# Patient Record
Sex: Female | Born: 1990 | Race: Black or African American | Hispanic: No | Marital: Single | Smoking: Never smoker
Health system: Southern US, Community
[De-identification: ages and names within clinical notes are randomized; demographics above are authoritative.]

## PROBLEM LIST (undated history)

## (undated) HISTORY — PX: NO PAST SURGERIES: SHX2092

---
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
## 2013-01-21 ENCOUNTER — Observation Stay: Payer: Self-pay | Admitting: Obstetrics and Gynecology

## 2013-01-21 LAB — URINALYSIS, COMPLETE
Ketone: NEGATIVE
Nitrite: NEGATIVE
Ph: 6 (ref 4.5–8.0)
Squamous Epithelial: 19
WBC UR: 106 /HPF (ref 0–5)

## 2013-01-21 LAB — FETAL FIBRONECTIN: Appearance: NORMAL

## 2013-01-22 ENCOUNTER — Ambulatory Visit: Payer: Self-pay | Admitting: Advanced Practice Midwife

## 2013-03-15 ENCOUNTER — Observation Stay: Payer: Self-pay | Admitting: Obstetrics and Gynecology

## 2013-03-15 LAB — CBC WITH DIFFERENTIAL/PLATELET
Basophil #: 0 10*3/uL (ref 0.0–0.1)
HCT: 34.9 % — ABNORMAL LOW (ref 35.0–47.0)
Lymphocyte #: 1.1 10*3/uL (ref 1.0–3.6)
MCH: 29.8 pg (ref 26.0–34.0)
MCV: 90 fL (ref 80–100)
Monocyte #: 0.4 x10 3/mm (ref 0.2–0.9)
Neutrophil #: 4.5 10*3/uL (ref 1.4–6.5)
Platelet: 187 10*3/uL (ref 150–440)
WBC: 6 10*3/uL (ref 3.6–11.0)

## 2013-03-16 ENCOUNTER — Inpatient Hospital Stay: Payer: Self-pay | Admitting: Obstetrics and Gynecology

## 2013-03-16 LAB — CBC WITH DIFFERENTIAL/PLATELET
Basophil #: 0 10*3/uL (ref 0.0–0.1)
Basophil %: 0.4 %
Eosinophil %: 0.2 %
HCT: 36.3 % (ref 35.0–47.0)
Lymphocyte #: 1.3 10*3/uL (ref 1.0–3.6)
Lymphocyte %: 15.8 %
MCH: 30.2 pg (ref 26.0–34.0)
MCV: 89 fL (ref 80–100)
Monocyte #: 0.6 x10 3/mm (ref 0.2–0.9)
Monocyte %: 7.8 %
Neutrophil %: 75.8 %
Platelet: 207 10*3/uL (ref 150–440)
RDW: 14.3 % (ref 11.5–14.5)
WBC: 8.2 10*3/uL (ref 3.6–11.0)

## 2013-03-17 LAB — HEMATOCRIT: HCT: 33 % — ABNORMAL LOW (ref 35.0–47.0)

## 2013-03-17 LAB — GC/CHLAMYDIA PROBE AMP

## 2014-08-15 NOTE — H&P (Signed)
L&D Evaluation:  History Expanded:   HPI 24 yo BF G1P0 who presents to land d at 39 weeks and 3 days, wants to labor naturally and her water is intact. she ius found to be 5-6 cm, she has a circumvallate placenta. presented with Late PNC at 18 weeks to [facility]. unknown tdap status per records.    Gravida 1    Term 0    PreTerm 0    Abortion 0    Living 0    Blood Type B positive    Group B Strep Results (Result >5wks must be treated as unknown) negative    Maternal HIV Negative    Maternal Syphilis Ab Nonreactive    Maternal Varicella Immune    Rubella Results immune    Maternal T-Dap Immune    [facility] 13-Sep-2011    Presents with contractions    Patient's Medical History No Chronic Illness    Patient's Surgical History none    Medications Pre Natal Vitamins    Allergies NKDA    Social History none    Family History Non-Contributory   ROS:   ROS All systems were reviewed.  HEENT, CNS, GI, GU, Respiratory, CV, Renal and Musculoskeletal systems were found to be normal.   Exam:   Vital Signs stable    General no apparent distress    Mental Status clear    Chest clear    Heart normal sinus rhythm    Abdomen gravid, tender with contractions    Estimated Fetal Weight Average for gestational age    Fetal Position v    Fundal Height term    Back no CVAT    Edema no edema    Reflexes 1+    Pelvic other, 5 cm,    Mebranes Intact    FHT normal rate with no decels, strictly reactive    Fetal Heart Rate 140    Ucx regular    Skin dry    Lymph no lymphadenopathy   Impression:   Impression active labor   Plan:   Plan EFM/NST, monitor contractions and for cervical change    Comments anticipate SVD    Follow Up Appointment need to schedule. in 6 weeks   Electronic Signatures: Erik Obey (MD)  (Signed 26-May-13 21:18)  Authored: L&D Evaluation   Last Updated: 26-May-13 21:18 by Erik Obey (MD)

## 2014-08-15 NOTE — H&P (Signed)
L&D Evaluation:  History Expanded:  HPI 24 yo G3P1011 at [redacted]w[redacted]d gestational age by mid trimester ultrasound with EDD of 03/18/13.  She was sent over from clinic after being examined and cervix found to be dilated to 1-2 cm.  She notes pelvic pressure for several weeks - worse over the past week and keeping her from sleeping at times.  She has no other symptoms. Specifically, she denies urinary, GI, and vaginal symptoms.  She notes positive fetal movement, no leakage of fluid, and no vaginal bleeding.  She can not identify discrete contractions.  However, she notes that she had pelvic pressure with her first child and arrived on L&D where her exam was 6cm.  She denies anything per vagina in the past 24 hours.  Her pregnancy has been complicated by late presentation to care. She also had a short interpregnancy time period.   Blood Type (Maternal) B positive   Group B Strep Results Maternal (Result >5wks must be treated as unknown) unknown/result > 5 weeks ago  collected today   Maternal HIV Negative   Maternal Varicella Immune   Rubella Results (Maternal) immune   [facility] 18-Mar-2013   Patient's Medical History No Chronic Illness  Sickle Cell Trait   Patient's Surgical History none   Medications Pre Natal Vitamins   Allergies NKDA   Social History none   Family History Non-Contributory   ROS:  ROS All systems were reviewed.  HEENT, CNS, GI, GU, Respiratory, CV, Renal and Musculoskeletal systems were found to be normal., unless noted n HPI   Exam:  Vital Signs stable  T 98.66F, P89, RR 18, BP 123/71   Urine Protein negative dipstick   General no apparent distress   Mental Status clear   Chest clear   Heart normal sinus rhythm   Abdomen gravid, non-tender   Estimated Fetal Weight Average for gestational age   Back no CVAT   Edema no edema   Pelvic no external lesions, 1-2(closer to 1 by my exam)/50/-3, posterior, moderate consistency   Mebranes Intact   FHT normal  rate with no decels   FHT Description 135/mod var/+accels/no decels   Ucx occasional   Skin no lesions   Other fFN positive   Impression:  Impression preterm labor, no evidence of current labor   Plan:  Plan UA, EFM/NST, monitor contractions and for cervical change, discharge   Comments Preterm labor: patient has remained stable over about 7 hours.  Her pressure symptoms have improved greatly.  Discussed outpatient management of her early labor.  Discussed that there is no way to know for certain when the actual cervical change occured. Discussed recommendation for betamethasone administration. Discussed need for her to return in 24 hours for second dose.  discussed that she should have a very low threshold to return to L&D should her pressure symptoms return.  Send GBS, send urine for culture, as below.  UTI: U/A suggestive of infection.  Will send urine for culture.  Rx given for macrobid to begin because she is having pressure.  Fetal well being: reassuring overall.  Follow up: recommend early follow up next week.   Follow Up Appointment need to schedule. 3-4 days for follow up of symptoms   Labs:  Lab Results: BF Analysis:  17-Oct-14 10:58   Fetal Fibronectin (comp) POSITIVE-Fetal Fibronectin DETECTED.  Appearance NORMAL  Characteristic CLEAR, COLORLESS, AQUEOUS Specimen characteristic that may be outside the parameter of a clear, colorless, aqueous sample (i.e. pink, bloody, yellow, thick, mucus-like) may cause an  invalid or false positive result.  Routine UA:  17-Oct-14 10:58   Color (UA) Yellow  Clarity (UA) Cloudy  Glucose (UA) Negative  Bilirubin (UA) Negative  Ketones (UA) Negative  Specific Gravity (UA) 1.027  Blood (UA) 1+  pH (UA) 6.0  Protein (UA) 30 mg/dL  Nitrite (UA) Negative  Leukocyte Esterase (UA) 3+ (Result(s) reported on 21 Jan 2013 at 12:33PM.)  RBC (UA) 8 /HPF  WBC (UA) 106 /HPF  Bacteria (UA) 1+  Epithelial Cells (UA) 19 /HPF  WBC Clump  (UA) PRESENT  Mucous (UA) PRESENT (Result(s) reported on 21 Jan 2013 at 12:33PM.)   Electronic Signatures: Will Bonnet (MD)  (Signed 17-Oct-14 18:30)  Authored: L&D Evaluation, Labs   Last Updated: 17-Oct-14 18:30 by Will Bonnet (MD)

## 2014-08-15 NOTE — H&P (Signed)
L&D Evaluation:  History:  HPI 24 yo G3P1011 at [redacted]w[redacted]d gestational age by mid trimester ultrasound with EDD of 03/18/13. She reports to L&D with c/o pelvic pressure.  She notes that she was seen in L&D in October for pelvic pressure and was found to be 1cm dilated with a +FFN and a UTI. She recieved betamethasone at this point, was stable, and discharged home after pressure resolved with medication for her UTI as well. She notes positive fetal movement, no leakage of fluid, and no vaginal bleeding.  Her pregnancy has been complicated by late presentation to care. She also had a short interpregnancy time period.   Presents with other, vaginal pressure   Patient's Medical History No Chronic Illness  Sickle Cell Trait   Patient's Surgical History none   Medications Pre Natal Vitamins   Allergies NKDA   Social History none   Family History Non-Contributory   ROS:  ROS All systems were reviewed.  HEENT, CNS, GI, GU, Respiratory, CV, Renal and Musculoskeletal systems were found to be normal., unless noted n HPI   Exam:  Vital Signs stable   Urine Protein negative dipstick   General no apparent distress   Mental Status clear   Chest clear   Heart normal sinus rhythm   Abdomen gravid, non-tender   Estimated Fetal Weight Average for gestational age   Pelvic by RN 5/80/-1 posterior   Mebranes Intact   FHT normal rate with no decels   FHT Description 155 baseline +accels, moderate variability   Ucx irregular, every 6 on monitor, mild   Skin dry, no lesions   Lymph no lymphadenopathy   Impression:  Impression IUP at 39.4, r/o labor   Plan:  Plan EFM/NST, monitor contractions and for cervical change   Follow Up Appointment need to schedule   Electronic Signatures: Louisa Second (CNM)  (Signed 09-Dec-14 10:48)  Authored: L&D Evaluation   Last Updated: 09-Dec-14 10:48 by Louisa Second (CNM)

## 2014-08-15 NOTE — H&P (Signed)
L&D Evaluation:  History Expanded:  HPI 24 yo G3P1011 at [redacted]w[redacted]d gestational age by mid trimester ultrasound with EDD of 03/18/13. She reports to L&D with c/o pelvic pressure.  She notes that she was seen in L&D in October for pelvic pressure and was found to be 1cm dilated with a +FFN and a UTI. She recieved betamethasone at this point, was stable, and discharged home after pressure resolved with medication for her UTI as well. She notes positive fetal movement, no leakage of fluid, and no vaginal bleeding.  Her pregnancy has been complicated by late presentation to care. She also had a short interpregnancy time period. she was seen wearlier with ctx but stopped and was sent home on morphoine she is now here at 42 cm with painful ctx/.   Gravida 3   Term 1   PreTerm 0   Abortion 1   Living 1   Blood Type (Maternal) B positive   Group B Strep Results Maternal (Result >5wks must be treated as unknown) negative   Maternal HIV Negative   Maternal Syphilis Ab Nonreactive   Maternal Varicella Immune   Rubella Results (Maternal) immune   Maternal T-Dap Immune   [facility] 18-Mar-2013   Presents with contractions, vaginal pressure   Patient's Medical History No Chronic Illness  Sickle Cell Trait   Patient's Surgical History none   Medications Pre Natal Vitamins   Allergies NKDA   Social History none   Family History Non-Contributory   ROS:  ROS All systems were reviewed.  HEENT, CNS, GI, GU, Respiratory, CV, Renal and Musculoskeletal systems were found to be normal., unless noted n HPI   Exam:  Vital Signs stable   Urine Protein negative dipstick   General no apparent distress   Mental Status clear   Chest clear   Heart normal sinus rhythm   Abdomen gravid, non-tender   Estimated Fetal Weight Average for gestational age   Pelvic by RN7/90 posterior   Mebranes Intact   FHT normal rate with no decels, cat 1   FHT Description 155 baseline +accels, moderate  variability   Ucx regular   Ucx Frequency 4 min   Skin dry, no lesions   Lymph no lymphadenopathy   Impression:  Impression IUP at 39.5 labor   Plan:  Plan EFM/NST, monitor contractions and for cervical change   Comments admit for delivery   Follow Up Appointment need to schedule   Electronic Signatures: Erik Obey (MD)  (Signed 10-Dec-14 05:54)  Authored: L&D Evaluation   Last Updated: 10-Dec-14 05:54 by Erik Obey (MD)

## 2014-08-23 ENCOUNTER — Ambulatory Visit
Admission: EM | Admit: 2014-08-23 | Discharge: 2014-08-23 | Disposition: A | Discharge status: Home / Self Care | Payer: Medicaid Other | Attending: Family Medicine | Admitting: Family Medicine

## 2014-08-23 ENCOUNTER — Ambulatory Visit: Payer: Medicaid Other

## 2014-08-23 ENCOUNTER — Encounter: Payer: Self-pay | Admitting: Emergency Medicine

## 2014-08-23 DIAGNOSIS — M25561 Pain in right knee: Secondary | ICD-10-CM | POA: Diagnosis not present

## 2014-08-23 MED ORDER — IBUPROFEN 800 MG PO TABS
800.0000 mg | ORAL_TABLET | Freq: Once | ORAL | Status: AC
  Administered 2014-08-23: 800 mg via ORAL

## 2014-08-23 MED ORDER — MELOXICAM 15 MG PO TABS: 15.0000 mg | ORAL_TABLET | Freq: Every day | ORAL | Status: DC

## 2014-08-23 NOTE — ED Provider Notes (Signed)
CSN: [id number]     Arrival date & time 08/23/14  5498 History   First MD Initiated Contact with Patient 08/23/14 1008     Chief Complaint  Patient presents with  . Knee Pain  . Knee Injury   (Consider location/radiation/quality/duration/timing/severity/associated sxs/prior Treatment) HPI   24 year old female presents for evaluation of a right knee injury. She was at her daughter's daycare this morning when she slipped and felt a pop in the medial portion of her right knee. This immediately caused some soreness up into her thigh but that has resolved. Now she has pain in the medial joint line. This is increased with ambulation and she has not been able to bear full weight on her knee since this happened. The knee is not swelling. She denies any systemic symptoms. No history of knee injuries     History reviewed. No pertinent past medical history. History reviewed. No pertinent past surgical history. History reviewed. No pertinent family history. History  Substance Use Topics  . Smoking status: Never Smoker   . Smokeless tobacco: Never Used  . Alcohol Use: No   OB History    No data available     Review of Systems  Musculoskeletal: Positive for arthralgias. Negative for joint swelling.  All other systems reviewed and are negative.   Allergies  Review of patient's allergies indicates no known allergies.  Home Medications   Prior to Admission medications   Medication Sig Start Date End Date Taking? Authorizing Provider  meloxicam (MOBIC) 15 MG tablet Take 1 tablet (15 mg total) by mouth daily. 08/23/14   Freeman Caldron Kashonda Sarkisyan, PA-C   BP 148/92 mmHg  Pulse 90  Temp(Src) 97.5 F (36.4 C) (Tympanic)  Resp 16  Ht 5\' 9"  (1.753 m)  Wt 165 lb (74.844 kg)  BMI 24.36 kg/m2  SpO2 98%  LMP  Physical Exam  Constitutional: She is oriented to person, place, and time. Vital signs are normal. She appears well-developed and well-nourished. No distress.  HENT:  Head: Normocephalic and  atraumatic.  Cardiovascular:  Pulses:      Dorsalis pedis pulses are 2+ on the right side.  Pulmonary/Chest: Effort normal. No respiratory distress.  Musculoskeletal:       Right knee: She exhibits normal range of motion, no swelling, no effusion, no deformity, no erythema, no LCL laxity, normal patellar mobility, normal meniscus and no MCL laxity. Tenderness found. Medial joint line and MCL tenderness noted. No lateral joint line, no LCL and no patellar tendon tenderness noted.       Right upper leg: Normal.       Right lower leg: Normal.  Neurological: She is alert and oriented to person, place, and time. She has normal strength. She exhibits normal muscle tone. Coordination normal.  Skin: Skin is warm and dry. No rash noted. She is not diaphoretic.  Psychiatric: She has a normal mood and affect. Judgment normal.  Nursing note and vitals reviewed.   ED Course  Procedures (including critical care time) Labs Review Labs Reviewed - No data to display  Imaging Review Dg Knee Complete 4 Views Right  08/23/2014   CLINICAL DATA:  Slipped twisting knee with pain medially  EXAM: RIGHT KNEE - COMPLETE 4+ VIEW  COMPARISON:  None.  FINDINGS: The right knee joint spaces appear normal and well preserved. No fracture is seen. No joint effusion is noted.  IMPRESSION: Negative.   Electronically Signed   By: Ivar Drape M.D.   On: 08/23/2014 11:08  MDM   1. Knee pain, right    x-rays negative. MCL sprain versus medial meniscus injury. Treat with NSAID, knee immobilizer and crutches, follow-up with orthopedics.  Meds ordered this encounter  Medications  . ibuprofen (ADVIL,MOTRIN) tablet 800 mg    Sig:   . meloxicam (MOBIC) 15 MG tablet    Sig: Take 1 tablet (15 mg total) by mouth daily.    Dispense:  30 tablet    Refill:  0       Liam Graham, PA-C 08/23/14 1129

## 2014-08-23 NOTE — Discharge Instructions (Signed)
Cryotherapy °Cryotherapy means treatment with cold. Ice or gel packs can be used to reduce both pain and swelling. Ice is the most helpful within the first 24 to 48 hours after an injury or flare-up from overusing a muscle or joint. Sprains, strains, spasms, burning pain, shooting pain, and aches can all be eased with ice. Ice can also be used when recovering from surgery. Ice is effective, has very few side effects, and is safe for most people to use. °PRECAUTIONS  °Ice is not a safe treatment option for people with: °· Raynaud phenomenon. This is a condition affecting small blood vessels in the extremities. Exposure to cold may cause your problems to return. °· Cold hypersensitivity. There are many forms of cold hypersensitivity, including: °· Cold urticaria. Red, itchy hives appear on the skin when the tissues begin to warm after being iced. °· Cold erythema. This is a red, itchy rash caused by exposure to cold. °· Cold hemoglobinuria. Red blood cells break down when the tissues begin to warm after being iced. The hemoglobin that carry oxygen are passed into the urine because they cannot combine with blood proteins fast enough. °· Numbness or altered sensitivity in the area being iced. °If you have any of the following conditions, do not use ice until you have discussed cryotherapy with your caregiver: °· Heart conditions, such as arrhythmia, angina, or chronic heart disease. °· High blood pressure. °· Healing wounds or open skin in the area being iced. °· Current infections. °· Rheumatoid arthritis. °· Poor circulation. °· Diabetes. °Ice slows the blood flow in the region it is applied. This is beneficial when trying to stop inflamed tissues from spreading irritating chemicals to surrounding tissues. However, if you expose your skin to cold temperatures for too long or without the proper protection, you can damage your skin or nerves. Watch for signs of skin damage due to cold. °HOME CARE INSTRUCTIONS °Follow  these tips to use ice and cold packs safely. °· Place a dry or damp towel between the ice and skin. A damp towel will cool the skin more quickly, so you may need to shorten the time that the ice is used. °· For a more rapid response, add gentle compression to the ice. °· Ice for no more than 10 to 20 minutes at a time. The bonier the area you are icing, the less time it will take to get the benefits of ice. °· Check your skin after 5 minutes to make sure there are no signs of a poor response to cold or skin damage. °· Rest 20 minutes or more between uses. °· Once your skin is numb, you can end your treatment. You can test numbness by very lightly touching your skin. The touch should be so light that you do not see the skin dimple from the pressure of your fingertip. When using ice, most people will feel these normal sensations in this order: cold, burning, aching, and numbness. °· Do not use ice on someone who cannot communicate their responses to pain, such as small children or people with dementia. °HOW TO MAKE AN ICE PACK °Ice packs are the most common way to use ice therapy. Other methods include ice massage, ice baths, and cryosprays. Muscle creams that cause a cold, tingly feeling do not offer the same benefits that ice offers and should not be used as a substitute unless recommended by your caregiver. °To make an ice pack, do one of the following: °· Place crushed ice or a   bag of frozen vegetables in a sealable plastic bag. Squeeze out the excess air. Place this bag inside another plastic bag. Slide the bag into a pillowcase or place a damp towel between your skin and the bag.  Mix 3 parts water with 1 part rubbing alcohol. Freeze the mixture in a sealable plastic bag. When you remove the mixture from the freezer, it will be slushy. Squeeze out the excess air. Place this bag inside another plastic bag. Slide the bag into a pillowcase or place a damp towel between your skin and the bag. SEEK MEDICAL CARE  IF:  You develop white spots on your skin. This may give the skin a blotchy (mottled) appearance.  Your skin turns blue or pale.  Your skin becomes waxy or hard.  Your swelling gets worse. MAKE SURE YOU:   Understand these instructions.  Will watch your condition.  Will get help right away if you are not doing well or get worse. Document Released: 11/18/2010 Document Revised: 08/08/2013 Document Reviewed: 11/18/2010 [facility] Patient Information 2015 [place], [place]. This information is not intended to replace advice given to you by your health care provider. Make sure you discuss any questions you have with your health care provider.  Knee Pain The knee is the complex joint between your thigh and your lower leg. It is made up of bones, tendons, ligaments, and cartilage. The bones that make up the knee are:  The femur in the thigh.  The tibia and fibula in the lower leg.  The patella or kneecap riding in the groove on the lower femur. CAUSES  Knee pain is a common complaint with many causes. A few of these causes are:  Injury, such as:  A ruptured ligament or tendon injury.  Torn cartilage.  Medical conditions, such as:  Gout  Arthritis  Infections  Overuse, over training, or overdoing a physical activity. Knee pain can be minor or severe. Knee pain can accompany debilitating injury. Minor knee problems often respond well to self-care measures or get well on their own. More serious injuries may need medical intervention or even surgery. SYMPTOMS The knee is complex. Symptoms of knee problems can vary widely. Some of the problems are:  Pain with movement and weight bearing.  Swelling and tenderness.  Buckling of the knee.  Inability to straighten or extend your knee.  Your knee locks and you cannot straighten it.  Warmth and redness with pain and fever.  Deformity or dislocation of the kneecap. DIAGNOSIS  Determining what is wrong may be very straight  forward such as when there is an injury. It can also be challenging because of the complexity of the knee. Tests to make a diagnosis may include:  Your caregiver taking a history and doing a physical exam.  Routine X-rays can be used to rule out other problems. X-rays will not reveal a cartilage tear. Some injuries of the knee can be diagnosed by:  Arthroscopy a surgical technique by which a small video camera is inserted through tiny incisions on the sides of the knee. This procedure is used to examine and repair internal knee joint problems. Tiny instruments can be used during arthroscopy to repair the torn knee cartilage (meniscus).  Arthrography is a radiology technique. A contrast liquid is directly injected into the knee joint. Internal structures of the knee joint then become visible on X-ray film.  An MRI scan is a non X-ray radiology procedure in which magnetic fields and a computer produce two- or three-dimensional images  of the inside of the knee. Cartilage tears are often visible using an MRI scanner. MRI scans have largely replaced arthrography in diagnosing cartilage tears of the knee.  Blood work.  Examination of the fluid that helps to lubricate the knee joint (synovial fluid). This is done by taking a sample out using a needle and a syringe. TREATMENT The treatment of knee problems depends on the cause. Some of these treatments are:  Depending on the injury, proper casting, splinting, surgery, or physical therapy care will be needed.  Give yourself adequate recovery time. Do not overuse your joints. If you begin to get sore during workout routines, back off. Slow down or do fewer repetitions.  For repetitive activities such as cycling or running, maintain your strength and nutrition.  Alternate muscle groups. For example, if you are a weight lifter, work the upper body on one day and the lower body the next.  Either tight or weak muscles do not give the proper support for  your knee. Tight or weak muscles do not absorb the stress placed on the knee joint. Keep the muscles surrounding the knee strong.  Take care of mechanical problems.  If you have flat feet, orthotics or special shoes may help. See your caregiver if you need help.  Arch supports, sometimes with wedges on the inner or outer aspect of the heel, can help. These can shift pressure away from the side of the knee most bothered by osteoarthritis.  A brace called an "unloader" brace also may be used to help ease the pressure on the most arthritic side of the knee.  If your caregiver has prescribed crutches, braces, wraps or ice, use as directed. The acronym for this is PRICE. This means protection, rest, ice, compression, and elevation.  Nonsteroidal anti-inflammatory drugs (NSAIDs), can help relieve pain. But if taken immediately after an injury, they may actually increase swelling. Take NSAIDs with food in your stomach. Stop them if you develop stomach problems. Do not take these if you have a history of ulcers, stomach pain, or bleeding from the bowel. Do not take without your caregiver's approval if you have problems with fluid retention, heart failure, or kidney problems.  For ongoing knee problems, physical therapy may be helpful.  Glucosamine and chondroitin are over-the-counter dietary supplements. Both may help relieve the pain of osteoarthritis in the knee. These medicines are different from the usual anti-inflammatory drugs. Glucosamine may decrease the rate of cartilage destruction.  Injections of a corticosteroid drug into your knee joint may help reduce the symptoms of an arthritis flare-up. They may provide pain relief that lasts a few months. You may have to wait a few months between injections. The injections do have a small increased risk of infection, water retention, and elevated blood sugar levels.  Hyaluronic acid injected into damaged joints may ease pain and provide lubrication.  These injections may work by reducing inflammation. A series of shots may give relief for as long as 6 months.  Topical painkillers. Applying certain ointments to your skin may help relieve the pain and stiffness of osteoarthritis. Ask your pharmacist for suggestions. Many over the-counter products are approved for temporary relief of arthritis pain.  In some countries, doctors often prescribe topical NSAIDs for relief of chronic conditions such as arthritis and tendinitis. A review of treatment with NSAID creams found that they worked as well as oral medications but without the serious side effects. PREVENTION  Maintain a healthy weight. Extra pounds put more strain on your  joints.  Get strong, stay limber. Weak muscles are a common cause of knee injuries. Stretching is important. Include flexibility exercises in your workouts.  Be smart about exercise. If you have osteoarthritis, chronic knee pain or recurring injuries, you may need to change the way you exercise. This does not mean you have to stop being active. If your knees ache after jogging or playing basketball, consider switching to swimming, water aerobics, or other low-impact activities, at least for a few days a week. Sometimes limiting high-impact activities will provide relief.  Make sure your shoes fit well. Choose footwear that is right for your sport.  Protect your knees. Use the proper gear for knee-sensitive activities. Use kneepads when playing volleyball or laying carpet. Buckle your seat belt every time you drive. Most shattered kneecaps occur in car accidents.  Rest when you are tired. SEEK MEDICAL CARE IF:  You have knee pain that is continual and does not seem to be getting better.  SEEK IMMEDIATE MEDICAL CARE IF:  Your knee joint feels hot to the touch and you have a high fever. MAKE SURE YOU:   Understand these instructions.  Will watch your condition.  Will get help right away if you are not doing well or get  worse. Document Released: 01/19/2007 Document Revised: 06/16/2011 Document Reviewed: 01/19/2007 [facility] Patient Information 2015 [place], [place]. This information is not intended to replace advice given to you by your health care provider. Make sure you discuss any questions you have with your health care provider.

## 2014-08-23 NOTE — ED Notes (Signed)
Patient states that she slipped at her daughter's daycare this morning and felt a pop in her R knee.  Patient c/o of R knee pain.

## 2015-03-22 ENCOUNTER — Encounter: Payer: Self-pay | Admitting: Internal Medicine

## 2015-03-22 DIAGNOSIS — J3081 Allergic rhinitis due to animal (cat) (dog) hair and dander: Secondary | ICD-10-CM | POA: Insufficient documentation

## 2015-08-01 ENCOUNTER — Ambulatory Visit: Payer: Self-pay

## 2015-08-01 ENCOUNTER — Ambulatory Visit (INDEPENDENT_AMBULATORY_CARE_PROVIDER_SITE_OTHER): Payer: Self-pay

## 2015-08-01 DIAGNOSIS — Z111 Encounter for screening for respiratory tuberculosis: Secondary | ICD-10-CM

## 2015-08-03 LAB — TB SKIN TEST
INDURATION: 0 mm
TB Skin Test: NEGATIVE

## 2016-01-18 ENCOUNTER — Encounter: Payer: Self-pay | Admitting: Internal Medicine

## 2016-01-18 ENCOUNTER — Ambulatory Visit (INDEPENDENT_AMBULATORY_CARE_PROVIDER_SITE_OTHER): Payer: Self-pay | Admitting: Family Medicine

## 2016-01-18 VITALS — BP 138/86 | HR 84 | Temp 98.7°F | Resp 16 | Ht 69.0 in | Wt 165.0 lb

## 2016-01-18 DIAGNOSIS — N3090 Cystitis, unspecified without hematuria: Secondary | ICD-10-CM

## 2016-01-18 DIAGNOSIS — R350 Frequency of micturition: Secondary | ICD-10-CM

## 2016-01-18 LAB — POCT URINALYSIS DIPSTICK
BILIRUBIN UA: NEGATIVE
Blood, UA: 2
Glucose, UA: NEGATIVE
Nitrite, UA: POSITIVE
SPEC GRAV UA: 1.025
UROBILINOGEN UA: 0.2
pH, UA: 7

## 2016-01-18 MED ORDER — SULFAMETHOXAZOLE-TRIMETHOPRIM 800-160 MG PO TABS: 1.0000 | ORAL_TABLET | Freq: Two times a day (BID) | ORAL | 0 refills | Status: DC

## 2016-01-18 NOTE — Progress Notes (Signed)
Name: Monica Moran   MRN: [id number]    DOB: 13-May-1990   Date:01/18/2016       Progress Note  Subjective  Chief Complaint  Chief Complaint  Patient presents with  . Urinary Frequency    Urinary Frequency   This is a new problem. The current episode started yesterday. The problem occurs every urination. The problem has been waxing and waning. The quality of the pain is described as burning. The pain is mild. There has been no fever. She is sexually active. Associated symptoms include frequency. Pertinent negatives include no chills, discharge, flank pain, hematuria, hesitancy, nausea, possible pregnancy, sweats or urgency. She has tried nothing for the symptoms. The treatment provided mild relief.    No problem-specific Assessment & Plan notes found for this encounter.   History reviewed. No pertinent past medical history.  History reviewed. No pertinent surgical history.  History reviewed. No pertinent family history.  Social History   Social History  . Marital status: Single    Spouse name: N/A  . Number of children: N/A  . Years of education: N/A   Occupational History  . Not on file.   Social History Main Topics  . Smoking status: Never Smoker  . Smokeless tobacco: Never Used  . Alcohol use No  . Drug use: No  . Sexual activity: Not on file   Other Topics Concern  . Not on file   Social History Narrative  . No narrative on file    No Known Allergies   Review of Systems  Constitutional: Negative for chills, fever, malaise/fatigue and weight loss.  HENT: Negative for ear discharge, ear pain and sore throat.   Eyes: Negative for blurred vision.  Respiratory: Negative for cough, sputum production, shortness of breath and wheezing.   Cardiovascular: Negative for chest pain, palpitations and leg swelling.  Gastrointestinal: Negative for abdominal pain, blood in stool, constipation, diarrhea, heartburn, melena and nausea.  Genitourinary: Positive for  frequency. Negative for dysuria, flank pain, hematuria, hesitancy and urgency.  Musculoskeletal: Negative for back pain, joint pain, myalgias and neck pain.  Skin: Negative for rash.  Neurological: Negative for dizziness, tingling, sensory change, focal weakness and headaches.  Endo/Heme/Allergies: Negative for environmental allergies and polydipsia. Does not bruise/bleed easily.  Psychiatric/Behavioral: Negative for depression and suicidal ideas. The patient is not nervous/anxious and does not have insomnia.      Objective  Vitals:   01/18/16 1601  BP: 138/86  Pulse: 84  Resp: 16  Temp: 98.7 F (37.1 C)  SpO2: 95%  Weight: 165 lb (74.8 kg)  Height: 5\' 9"  (1.753 m)    Physical Exam  Constitutional: She is well-developed, well-nourished, and in no distress. No distress.  HENT:  Head: Normocephalic and atraumatic.  Right Ear: External ear normal.  Left Ear: External ear normal.  Nose: Nose normal.  Mouth/Throat: Oropharynx is clear and moist.  Eyes: Conjunctivae and EOM are normal. Pupils are equal, round, and reactive to light. Right eye exhibits no discharge. Left eye exhibits no discharge.  Neck: Normal range of motion. Neck supple. No JVD present. No thyromegaly present.  Cardiovascular: Normal rate, regular rhythm, normal heart sounds and intact distal pulses.  Exam reveals no gallop and no friction rub.   No murmur heard. Pulmonary/Chest: Effort normal and breath sounds normal. She has no wheezes. She has no rales.  Abdominal: Soft. Bowel sounds are normal. She exhibits no mass. There is tenderness in the suprapubic area. There is no guarding.  Musculoskeletal: Normal  range of motion. She exhibits no edema.  Lymphadenopathy:    She has no cervical adenopathy.  Neurological: She is alert. She has normal reflexes.  Skin: Skin is warm and dry. She is not diaphoretic.  Psychiatric: Mood and affect normal.  Nursing note and vitals reviewed.     Assessment &  Plan  Problem List Items Addressed This Visit    None    Visit Diagnoses    Urinary frequency    -  Primary   Relevant Orders   POCT urinalysis dipstick        Dr. Otilio Miu [facility]  01/18/16

## 2016-06-13 ENCOUNTER — Encounter: Payer: Self-pay | Admitting: Obstetrics & Gynecology

## 2016-06-13 ENCOUNTER — Ambulatory Visit (INDEPENDENT_AMBULATORY_CARE_PROVIDER_SITE_OTHER): Payer: BC Managed Care – PPO | Admitting: Obstetrics & Gynecology

## 2016-06-13 VITALS — BP 120/80 | HR 68 | Ht 65.0 in | Wt 193.0 lb

## 2016-06-13 DIAGNOSIS — Z789 Other specified health status: Secondary | ICD-10-CM | POA: Diagnosis not present

## 2016-06-13 NOTE — Progress Notes (Signed)
  History of Present Illness:  Monica Moran is a 26 y.o. that had a NEXPLANON placed approximately 1 month ago. Since that time, she states that no concerns.  Also in need of PAP as has not had PAP or annual in 3 years.  The following portions of the patient's history were reviewed and updated as appropriate: allergies, current medications, past family history, past medical history, past social history, past surgical history and problem list.  PMHx: She  has no past medical history on file. Also,  has no past surgical history on file., family history is not on file.,  reports that she has never smoked. She has never used smokeless tobacco. She reports that she does not drink alcohol or use drugs.  She has a current medication list which includes the following prescription(s): etonogestrel. Also, has No Known Allergies.  Review of Systems  Constitutional: Negative for chills, fever and malaise/fatigue.  HENT: Negative for congestion, sinus pain and sore throat.   Eyes: Negative for blurred vision and pain.  Respiratory: Negative for cough and wheezing.   Cardiovascular: Negative for chest pain and leg swelling.  Gastrointestinal: Negative for abdominal pain, constipation, diarrhea, heartburn, nausea and vomiting.  Genitourinary: Negative for dysuria, frequency, hematuria and urgency.  Musculoskeletal: Negative for back pain, joint pain, myalgias and neck pain.  Skin: Negative for itching and rash.  Neurological: Negative for dizziness, tremors and weakness.  Endo/Heme/Allergies: Does not bruise/bleed easily.  Psychiatric/Behavioral: Negative for depression. The patient is not nervous/anxious and does not have insomnia.     Physical Exam:  BP 120/80   Pulse 68   Ht 5\' 5"  (1.651 m)   Wt 193 lb (87.5 kg)   BMI 32.12 kg/m  Body mass index is 32.12 kg/m. Constitutional: Well nourished, well developed female in no acute distress.  Abdomen: diffusely non tender to palpation, non  distended, and no masses, hernias Neuro: Grossly intact Psych:  Normal mood and affect.   MS: left arm with palpable implant and no skin disruptions or swelling Chest clear Heart reg   Pelvic:   Vulva: Normal appearance.  No lesions.  Vagina: No lesions or abnormalities noted.  Support: Normal pelvic support.  Urethra No masses tenderness or scarring.  Meatus Normal size without lesions or prolapse.  Cervix: Normal appearance.  No lesions.  Anus: Normal exam.  No lesions.  Perineum: Normal exam.  No lesions.        Bimanual   Uterus: Normal size.  Non-tender.  Mobile.  AV.  Adnexae: No masses.  Non-tender to palpation.  Cul-de-sac: Negative for abnormality.    Assessment:  Nexplanon present in proper location; pt doing well Also, PAP done w normal pelvic exam  Plan: She was told to continue to use barrier contraception, in order to prevent any STIs, and to take a home pregnancy test or call us if she ever thinks she may be pregnant, and that her Nexplanon expires in 3 years.  She was amenable to this plan and we will see her back for annual/PRN.  Barnett Applebaum, MD, [facility], [facility] 06/13/2016  1:55 PM

## 2016-06-13 NOTE — Patient Instructions (Signed)

## 2016-06-18 LAB — PAP LB, CT-NG, RFX HPV ASCU
CHLAMYDIA, NUC. ACID AMP: NEGATIVE
Gonococcus, Nuc. Acid Amp: NEGATIVE
PAP Smear Comment: 0

## 2016-12-04 ENCOUNTER — Ambulatory Visit (INDEPENDENT_AMBULATORY_CARE_PROVIDER_SITE_OTHER): Payer: BC Managed Care – PPO

## 2016-12-04 ENCOUNTER — Ambulatory Visit
Admission: EM | Admit: 2016-12-04 | Discharge: 2016-12-04 | Disposition: A | Discharge status: Home / Self Care | Payer: BC Managed Care – PPO | Attending: Family Medicine | Admitting: Family Medicine

## 2016-12-04 DIAGNOSIS — R519 Headache, unspecified: Secondary | ICD-10-CM

## 2016-12-04 DIAGNOSIS — Z3202 Encounter for pregnancy test, result negative: Secondary | ICD-10-CM

## 2016-12-04 DIAGNOSIS — R112 Nausea with vomiting, unspecified: Secondary | ICD-10-CM | POA: Diagnosis not present

## 2016-12-04 DIAGNOSIS — R0602 Shortness of breath: Secondary | ICD-10-CM | POA: Diagnosis not present

## 2016-12-04 DIAGNOSIS — R8271 Bacteriuria: Secondary | ICD-10-CM

## 2016-12-04 DIAGNOSIS — R42 Dizziness and giddiness: Secondary | ICD-10-CM

## 2016-12-04 DIAGNOSIS — R0789 Other chest pain: Secondary | ICD-10-CM | POA: Diagnosis not present

## 2016-12-04 DIAGNOSIS — R079 Chest pain, unspecified: Secondary | ICD-10-CM

## 2016-12-04 DIAGNOSIS — R51 Headache: Secondary | ICD-10-CM | POA: Diagnosis not present

## 2016-12-04 LAB — URINALYSIS, COMPLETE (UACMP) WITH MICROSCOPIC
GLUCOSE, UA: NEGATIVE mg/dL
Hgb urine dipstick: NEGATIVE
LEUKOCYTES UA: NEGATIVE
Nitrite: NEGATIVE
PH: 7 (ref 5.0–8.0)
Protein, ur: 30 mg/dL — AB
SPECIFIC GRAVITY, URINE: 1.025 (ref 1.005–1.030)

## 2016-12-04 LAB — RAPID STREP SCREEN (MED CTR MEBANE ONLY): Streptococcus, Group A Screen (Direct): NEGATIVE

## 2016-12-04 LAB — PREGNANCY, URINE: PREG TEST UR: NEGATIVE

## 2016-12-04 MED ORDER — ONDANSETRON 8 MG PO TBDP: 8.0000 mg | ORAL_TABLET | Freq: Three times a day (TID) | ORAL | 0 refills | Status: DC | PRN

## 2016-12-04 MED ORDER — CEPHALEXIN 500 MG PO CAPS: 500.0000 mg | ORAL_CAPSULE | Freq: Two times a day (BID) | ORAL | 0 refills | Status: AC

## 2016-12-04 NOTE — ED Provider Notes (Signed)
[facility]    CSN: [id number] Arrival date & time: 12/04/16  2703     History   Chief Complaint Chief Complaint  Patient presents with  . Chest Pain    HPI Monica Moran is a 26 y.o. female.   Patient's 26 year old black female with a positive review of systems. She reports having chest pain which she has several months ago as clear this was not as intense ER doctors and anxiety spell. She reports some shortness of breath no cough when previously she had a cough associated with chest pain. While the chest pain she's had headache also very vague as far as when the headache started along the medial going on she's had a sore throat but she states she has strep earlier this spring. As was before but still irritates her times. She denies any burning urination or urine did show bacteria present but it was not the best specimen unfortunately either.   The history is provided by the patient. No language interpreter was used.  Chest Pain  Pain location:  Substernal area Pain quality: pressure and tightness   Pain radiates to:  Does not radiate Pain severity:  Moderate Onset quality:  Unable to specify Timing:  Intermittent Progression:  Waxing and waning Chronicity:  Recurrent Context: breathing   Relieved by:  Nothing Ineffective treatments:  None tried Associated symptoms: anxiety, dizziness, dysphagia, headache, nausea, shortness of breath and vomiting     History reviewed. No pertinent past medical history.  Patient Active Problem List   Diagnosis Date Noted  . Uses contraceptive implants for birth control 06/13/2016  . 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 No pertinent surgical history.  OB History    Gravida Para Term Preterm AB Living   2 2 0     2   SAB TAB Ectopic Multiple Live Births                   Home Medications    Prior to Admission medications   Medication Sig Start Date End Date Taking? Authorizing Provider    cephALEXin (KEFLEX) 500 MG capsule Take 1 capsule (500 mg total) by mouth 2 (two) times daily. 12/04/16 12/11/16  Frederich Cha, MD  etonogestrel (NEXPLANON) 68 MG IMPL implant 1 each by Subdermal route once.    [provider]  ondansetron (ZOFRAN ODT) 8 MG disintegrating tablet Take 1 tablet (8 mg total) by mouth every 8 (eight) hours as needed for nausea or vomiting. 12/04/16   Frederich Cha, MD    Family History History reviewed. No pertinent family history.  Social History Social History  Substance Use Topics  . Smoking status: Never Smoker  . Smokeless tobacco: Never Used  . Alcohol use No     Allergies   Patient has no known allergies.   Review of Systems Review of Systems  HENT: Positive for sore throat, trouble swallowing and voice change.   Respiratory: Positive for shortness of breath.   Cardiovascular: Positive for chest pain.  Gastrointestinal: Positive for nausea and vomiting.  Neurological: Positive for dizziness and headaches.  All other systems reviewed and are negative.    Physical Exam Triage Vital Signs ED Triage Vitals  Enc Vitals Group     BP 12/04/16 0848 130/90     Pulse Rate 12/04/16 0848 76     Resp 12/04/16 0848 18     Temp 12/04/16 0848 98.4 F (36.9 C)     Temp Source 12/04/16 0848  Oral     SpO2 12/04/16 0848 100 %     Weight 12/04/16 0849 195 lb 6 oz (88.6 kg)     Height --      Head Circumference --      Peak Flow --      Pain Score 12/04/16 0849 6     Pain Loc --      Pain Edu? --      Excl. in [place]? --    No data found.   Updated Vital Signs BP 130/90 (BP Location: Left Arm)   Pulse 76   Temp 98.4 F (36.9 C) (Oral)   Resp 18   Wt 195 lb 6 oz (88.6 kg)   LMP  (Approximate) Comment: denies preg  SpO2 100%   BMI 32.51 kg/m   Visual Acuity Right Eye Distance:   Left Eye Distance:   Bilateral Distance:    Right Eye Near:   Left Eye Near:    Bilateral Near:     Physical Exam  Constitutional: She is oriented to  person, place, and time. She appears well-developed and well-nourished. No distress.  HENT:  Head: Normocephalic.  Eyes: Pupils are equal, round, and reactive to light.  Neck: Normal range of motion. Neck supple. No thyromegaly present.  Cardiovascular: Normal rate, regular rhythm and normal heart sounds.   Pulmonary/Chest: Effort normal and breath sounds normal. No respiratory distress.  Abdominal: Soft.  Musculoskeletal: Normal range of motion. She exhibits no edema or deformity.  Neurological: She is alert and oriented to person, place, and time. No cranial nerve deficit. Coordination normal.  Skin: Skin is warm. She is not diaphoretic.  Psychiatric: She has a normal mood and affect.  Vitals reviewed.    UC Treatments / Results  Labs (all labs ordered are listed, but only abnormal results are displayed) Labs Reviewed  URINALYSIS, COMPLETE (UACMP) WITH MICROSCOPIC - Abnormal; Notable for the following:       Result Value   Color, Urine AMBER (*)    APPearance HAZY (*)    Bilirubin Urine SMALL (*)    Ketones, ur TRACE (*)    Protein, ur 30 (*)    Squamous Epithelial / LPF 6-30 (*)    Bacteria, UA FEW (*)    All other components within normal limits  RAPID STREP SCREEN (NOT AT [facility])  URINE CULTURE  CULTURE, GROUP A STREP [facility])  PREGNANCY, URINE    EKG  EKG Interpretation None      ED ECG REPORT I, Lazarus Sudbury H, the attending physician, personally viewed and interpreted this ECG.   Date: 12/04/2016  EKG Time:09:$#:33  Rate:74  Rhythm: normal EKG, normal sinus rhythm, there are no previous tracings available for comparison  Axis: 33  Intervals:none  ST&T Change: none Radiology Dg Chest 2 View  Result Date: 12/04/2016 CLINICAL DATA:  26 year old female with 2 days of mid chest pain and shortness of breath. EXAM: CHEST  2 VIEW COMPARISON:  None. FINDINGS: The heart size and mediastinal contours are normal. Both lungs are clear. No pneumothorax or pleural  effusion. Visualized tracheal air column is within normal limits. Mild to moderate thoracolumbar scoliosis. No acute osseous abnormality identified. Negative visible bowel gas pattern. IMPRESSION: Normal aside from mild scoliosis. No acute cardiopulmonary abnormality. Electronically Signed   By: Genevie Ann M.D.   On: 12/04/2016 09:43    Procedures Procedures (including critical care time)  Medications Ordered in UC Medications - No data to display  Results for orders placed  or performed during the hospital encounter of 12/04/16  Rapid strep screen  Result Value Ref Range   Streptococcus, Group A Screen (Direct) NEGATIVE NEGATIVE  Urinalysis, Complete w Microscopic  Result Value Ref Range   Color, Urine AMBER (A) YELLOW   APPearance HAZY (A) CLEAR   Specific Gravity, Urine 1.025 1.005 - 1.030   pH 7.0 5.0 - 8.0   Glucose, UA NEGATIVE NEGATIVE mg/dL   Hgb urine dipstick NEGATIVE NEGATIVE   Bilirubin Urine SMALL (A) NEGATIVE   Ketones, ur TRACE (A) NEGATIVE mg/dL   Protein, ur 30 (A) NEGATIVE mg/dL   Nitrite NEGATIVE NEGATIVE   Leukocytes, UA NEGATIVE NEGATIVE   Squamous Epithelial / LPF 6-30 (A) NONE SEEN   WBC, UA 6-30 0 - 5 WBC/hpf   RBC / HPF 0-5 0 - 5 RBC/hpf   Bacteria, UA FEW (A) NONE SEEN   Mucus PRESENT   Pregnancy, urine  Result Value Ref Range   Preg Test, Ur NEGATIVE NEGATIVE    Initial Impression / Assessment and Plan / UC Course  I have reviewed the triage vital signs and the nursing notes.  Pertinent labs & imaging results that were available during my care of the patient were reviewed by me and considered in my medical decision making (see chart for details).     Patient with multiple problems and multiple issues this time may be all viral she does have bacteria but she is not having symptoms of UTI but going to place on Keflex for week 500 mg 1 tablet twice day culture urine strep is also been cultured Zofran for nausea work today and tomorrow.  Final Clinical  Impressions(s) / UC Diagnoses   Final diagnoses:  Chest pain, unspecified type  Acute nonintractable headache, unspecified headache type  Non-intractable vomiting with nausea, unspecified vomiting type  Bacteriuria    New Prescriptions New Prescriptions   CEPHALEXIN (KEFLEX) 500 MG CAPSULE    Take 1 capsule (500 mg total) by mouth 2 (two) times daily.   ONDANSETRON (ZOFRAN ODT) 8 MG DISINTEGRATING TABLET    Take 1 tablet (8 mg total) by mouth every 8 (eight) hours as needed for nausea or vomiting.   Note: This dictation was prepared with Dragon dictation along with smaller phrase technology. Any transcriptional errors that result from this process are unintentional.  Controlled Substance Prescriptions [place] Controlled Substance Registry consulted? Not Applicable   Frederich Cha, MD 12/04/16 1028

## 2016-12-04 NOTE — ED Triage Notes (Signed)
Pt reports 2 days of intermittent headache, stomach "feels tight" , nausea, and chest feels tight. Pain 6/10

## 2016-12-05 LAB — URINE CULTURE: SPECIAL REQUESTS: NORMAL

## 2016-12-06 LAB — CULTURE, GROUP A STREP (THRC)

## 2017-04-27 ENCOUNTER — Encounter: Payer: Self-pay | Admitting: Internal Medicine

## 2017-04-27 ENCOUNTER — Ambulatory Visit: Payer: BC Managed Care – PPO | Admitting: Internal Medicine

## 2017-04-27 VITALS — BP 124/86 | HR 87 | Ht 65.0 in | Wt 192.0 lb

## 2017-04-27 DIAGNOSIS — Z111 Encounter for screening for respiratory tuberculosis: Secondary | ICD-10-CM | POA: Diagnosis not present

## 2017-04-27 DIAGNOSIS — J3081 Allergic rhinitis due to animal (cat) (dog) hair and dander: Secondary | ICD-10-CM

## 2017-04-27 DIAGNOSIS — Z0000 Encounter for general adult medical examination without abnormal findings: Secondary | ICD-10-CM | POA: Diagnosis not present

## 2017-04-27 MED ORDER — TUBERCULIN PPD 5 UNIT/0.1ML ID SOLN
5.0000 [IU] | Freq: Once | INTRADERMAL | Status: DC
  Administered 2017-04-27: 5 [IU] via INTRADERMAL

## 2017-04-27 NOTE — Patient Instructions (Signed)

## 2017-04-27 NOTE — Progress Notes (Signed)
Date:  04/27/2017   Name:  Monica Moran   DOB:  12-01-1990   MRN:  [id number]   Chief Complaint: Tb injection (Has not been seen since 01/2016. Needed OV to remain established patient. )  Monica Moran is a 27 y.o. female who presents today for her Complete Annual Exam. She feels well. She reports exercising running after her children but no structured program. She reports she is sleeping well.  She sees GYN for Pap.  Nexplanon was replaced last year. Immunizations are reviewed and recommendations provided.   Age appropriate screening tests are discussed. Counseling given for risk factor reduction interventions. She declines flu vaccine. She needs PPD for employment.  Review of Systems  Constitutional: Negative for chills, fatigue, fever and unexpected weight change.  Respiratory: Negative for cough, chest tightness, shortness of breath and wheezing.   Cardiovascular: Negative for chest pain.  Gastrointestinal: Negative for abdominal pain and constipation.  Genitourinary: Negative for dysuria and menstrual problem.  Musculoskeletal: Negative for arthralgias.  Skin: Negative for rash.  Allergic/Immunologic: Positive for environmental allergies.  Neurological: Negative for dizziness and headaches.  Psychiatric/Behavioral: Negative for sleep disturbance.    Patient Active Problem List   Diagnosis Date Noted  . Uses contraceptive implants for birth control 06/13/2016  . 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 Authorizing Provider  etonogestrel (NEXPLANON) 68 MG IMPL implant 1 each by Subdermal route once.    [provider]       Frederich Cha, MD    No Known Allergies  History reviewed. No pertinent surgical history.  Social History   Tobacco Use  . Smoking status: Never Smoker  . Smokeless tobacco: Never Used  Substance Use Topics  . Alcohol use: No  . Drug use: No      Medication list has been reviewed and updated.  PHQ 2/9 Scores 04/27/2017  PHQ - 2 Score 0    Physical Exam  Constitutional: She is oriented to person, place, and time. She appears well-developed. No distress.  HENT:  Head: Normocephalic and atraumatic.  Right Ear: Tympanic membrane and ear canal normal.  Left Ear: Tympanic membrane and ear canal normal.  Nose: Right sinus exhibits no maxillary sinus tenderness. Left sinus exhibits no maxillary sinus tenderness.  Mouth/Throat: No posterior oropharyngeal erythema.  Neck: Normal range of motion. Neck supple. Carotid bruit is not present. No thyromegaly present.  Cardiovascular: Normal rate, regular rhythm and normal heart sounds.  Pulmonary/Chest: Effort normal and breath sounds normal. No respiratory distress. She has no wheezes.  Abdominal: Soft. Bowel sounds are normal. She exhibits no distension. There is no tenderness.  Musculoskeletal: Normal range of motion. She exhibits no edema.  Neurological: She is alert and oriented to person, place, and time.  Skin: Skin is warm and dry. No rash noted.  Psychiatric: She has a normal mood and affect. Her behavior is normal. Thought content normal.  Nursing note and vitals reviewed.   BP 124/86   Pulse 87   Ht 5\' 5"  (1.651 m)   Wt 192 lb (87.1 kg)   SpO2 100%   BMI 31.95 kg/m   Assessment and Plan: 1. Annual physical exam Pap with GYN - CBC with Differential/Platelet - Comprehensive metabolic panel - Lipid panel - TSH  2. Encounter for screening for respiratory tuberculosis PPD today   No orders of the defined types were placed in this encounter.   Partially  dictated using Editor, commissioning. Any errors are unintentional.  Halina Maidens, MD [facility]  04/27/2017

## 2017-04-28 LAB — CBC WITH DIFFERENTIAL/PLATELET
Basophils Absolute: 0 10*3/uL (ref 0.0–0.2)
Basos: 0 %
EOS (ABSOLUTE): 0.1 10*3/uL (ref 0.0–0.4)
EOS: 1 %
HEMATOCRIT: 41.4 % (ref 34.0–46.6)
HEMOGLOBIN: 13.2 g/dL (ref 11.1–15.9)
IMMATURE GRANS (ABS): 0 10*3/uL (ref 0.0–0.1)
IMMATURE GRANULOCYTES: 0 %
LYMPHS: 31 %
Lymphocytes Absolute: 1.7 10*3/uL (ref 0.7–3.1)
MCH: 29.1 pg (ref 26.6–33.0)
MCHC: 31.9 g/dL (ref 31.5–35.7)
MCV: 91 fL (ref 79–97)
MONOCYTES: 6 %
Monocytes Absolute: 0.4 10*3/uL (ref 0.1–0.9)
Neutrophils Absolute: 3.5 10*3/uL (ref 1.4–7.0)
Neutrophils: 62 %
Platelets: 246 10*3/uL (ref 150–379)
RBC: 4.54 x10E6/uL (ref 3.77–5.28)
RDW: 13.7 % (ref 12.3–15.4)
WBC: 5.6 10*3/uL (ref 3.4–10.8)

## 2017-04-28 LAB — LIPID PANEL
CHOL/HDL RATIO: 2.2 ratio (ref 0.0–4.4)
Cholesterol, Total: 114 mg/dL (ref 100–199)
HDL: 51 mg/dL (ref >39)
LDL CALC: 57 mg/dL (ref 0–99)
Triglycerides: 32 mg/dL (ref 0–149)
VLDL Cholesterol Cal: 6 mg/dL (ref 5–40)

## 2017-04-28 LAB — COMPREHENSIVE METABOLIC PANEL
ALBUMIN: 4 g/dL (ref 3.5–5.5)
ALT: 6 IU/L (ref 0–32)
AST: 7 IU/L (ref 0–40)
Albumin/Globulin Ratio: 1.4 (ref 1.2–2.2)
Alkaline Phosphatase: 55 IU/L (ref 39–117)
BUN / CREAT RATIO: 20 (ref 9–23)
BUN: 13 mg/dL (ref 6–20)
Bilirubin Total: 0.3 mg/dL (ref 0.0–1.2)
CALCIUM: 9.1 mg/dL (ref 8.7–10.2)
CO2: 24 mmol/L (ref 20–29)
Chloride: 104 mmol/L (ref 96–106)
Creatinine, Ser: 0.66 mg/dL (ref 0.57–1.00)
GFR, EST AFRICAN AMERICAN: 143 mL/min/{1.73_m2} (ref >59)
GFR, EST NON AFRICAN AMERICAN: 124 mL/min/{1.73_m2} (ref >59)
GLUCOSE: 88 mg/dL (ref 65–99)
Globulin, Total: 2.9 g/dL (ref 1.5–4.5)
Potassium: 4.4 mmol/L (ref 3.5–5.2)
Sodium: 143 mmol/L (ref 134–144)
TOTAL PROTEIN: 6.9 g/dL (ref 6.0–8.5)

## 2017-04-28 LAB — TSH: TSH: 0.645 u[IU]/mL (ref 0.450–4.500)

## 2017-04-29 ENCOUNTER — Ambulatory Visit (INDEPENDENT_AMBULATORY_CARE_PROVIDER_SITE_OTHER): Payer: BC Managed Care – PPO

## 2017-04-29 DIAGNOSIS — Z111 Encounter for screening for respiratory tuberculosis: Secondary | ICD-10-CM

## 2017-04-29 LAB — TB SKIN TEST
Induration: 0 mm
TB Skin Test: NEGATIVE

## 2017-05-21 ENCOUNTER — Emergency Department
Admission: EM | Admit: 2017-05-21 | Discharge: 2017-05-22 | Disposition: A | Discharge status: Home / Self Care | Payer: BC Managed Care – PPO | Attending: Emergency Medicine | Admitting: Emergency Medicine

## 2017-05-21 ENCOUNTER — Other Ambulatory Visit: Payer: Self-pay

## 2017-05-21 DIAGNOSIS — F329 Major depressive disorder, single episode, unspecified: Secondary | ICD-10-CM

## 2017-05-21 DIAGNOSIS — F32A Depression, unspecified: Secondary | ICD-10-CM

## 2017-05-21 DIAGNOSIS — F4325 Adjustment disorder with mixed disturbance of emotions and conduct: Secondary | ICD-10-CM | POA: Diagnosis not present

## 2017-05-21 DIAGNOSIS — F330 Major depressive disorder, recurrent, mild: Secondary | ICD-10-CM

## 2017-05-21 DIAGNOSIS — F4322 Adjustment disorder with anxiety: Secondary | ICD-10-CM

## 2017-05-21 DIAGNOSIS — R45851 Suicidal ideations: Secondary | ICD-10-CM | POA: Insufficient documentation

## 2017-05-21 LAB — POCT PREGNANCY, URINE: Preg Test, Ur: NEGATIVE

## 2017-05-21 LAB — ACETAMINOPHEN LEVEL

## 2017-05-21 LAB — URINE DRUG SCREEN, QUALITATIVE (ARMC ONLY)
AMPHETAMINES, UR SCREEN: NOT DETECTED
Barbiturates, Ur Screen: NOT DETECTED
Benzodiazepine, Ur Scrn: NOT DETECTED
COCAINE METABOLITE, UR ~~LOC~~: NOT DETECTED
Cannabinoid 50 Ng, Ur ~~LOC~~: NOT DETECTED
MDMA (ECSTASY) UR SCREEN: NOT DETECTED
METHADONE SCREEN, URINE: NOT DETECTED
OPIATE, UR SCREEN: NOT DETECTED
Phencyclidine (PCP) Ur S: NOT DETECTED
Tricyclic, Ur Screen: NOT DETECTED

## 2017-05-21 LAB — COMPREHENSIVE METABOLIC PANEL
ALBUMIN: 4.2 g/dL (ref 3.5–5.0)
ALK PHOS: 58 U/L (ref 38–126)
ALT: 10 U/L — ABNORMAL LOW (ref 14–54)
ANION GAP: 8 (ref 5–15)
AST: 14 U/L — ABNORMAL LOW (ref 15–41)
BUN: 21 mg/dL — ABNORMAL HIGH (ref 6–20)
CALCIUM: 9.3 mg/dL (ref 8.9–10.3)
CO2: 24 mmol/L (ref 22–32)
Chloride: 107 mmol/L (ref 101–111)
Creatinine, Ser: 0.74 mg/dL (ref 0.44–1.00)
GFR calc Af Amer: >60 mL/min (ref >60)
GFR calc non Af Amer: >60 mL/min (ref >60)
GLUCOSE: 105 mg/dL — AB (ref 65–99)
Potassium: 3.9 mmol/L (ref 3.5–5.1)
SODIUM: 139 mmol/L (ref 135–145)
Total Bilirubin: 0.5 mg/dL (ref 0.3–1.2)
Total Protein: 7.7 g/dL (ref 6.5–8.1)

## 2017-05-21 LAB — CBC
HEMATOCRIT: 40.4 % (ref 35.0–47.0)
HEMOGLOBIN: 13.7 g/dL (ref 12.0–16.0)
MCH: 30.1 pg (ref 26.0–34.0)
MCHC: 33.9 g/dL (ref 32.0–36.0)
MCV: 88.9 fL (ref 80.0–100.0)
Platelets: 268 10*3/uL (ref 150–440)
RBC: 4.54 MIL/uL (ref 3.80–5.20)
RDW: 13.4 % (ref 11.5–14.5)
WBC: 7.4 10*3/uL (ref 3.6–11.0)

## 2017-05-21 LAB — SALICYLATE LEVEL: Salicylate Lvl: <7 mg/dL (ref 2.8–30.0)

## 2017-05-21 LAB — ETHANOL: Alcohol, Ethyl (B): <10 mg/dL (ref <10)

## 2017-05-21 NOTE — ED Notes (Signed)

## 2017-05-21 NOTE — ED Provider Notes (Signed)
[facility] Emergency Department Provider Note  ____________________________________________  Time seen: Approximately 10:22 PM  I have reviewed the triage vital signs and the nursing notes.   HISTORY  Chief Complaint Suicidal    HPI Monica Moran is a 27 y.o. female, otherwise healthy, presenting with depression and suicidal ideations.  The patient reports that she has recently split up with the father of her children, and today was feeling sad because of Valentine's Day.  She states that her mother heard her say that she "did not want to be here anymore," and suggested that she be evaluated in the emergency department for suicidal ideations.  The patient denies that she is actively suicidal and has not had any suicide plan.  She denies HI, hallucinations.  She has no medical complaints at this time.  History reviewed. No pertinent past medical history.  Patient Active Problem List   Diagnosis Date Noted  . Uses contraceptive implants for birth control 06/13/2016  . 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 No pertinent surgical history.  Current Outpatient Rx  . Order #: [id number] Class: Historical Med    Allergies Patient has no known allergies.  Family History  Problem Relation Age of Onset  . Hypertension Paternal Grandmother   . Diabetes Paternal Grandmother     Social History Social History   Tobacco Use  . Smoking status: Never Smoker  . Smokeless tobacco: Never Used  Substance Use Topics  . Alcohol use: No    Frequency: Never  . Drug use: No    Review of Systems Constitutional: No fever/chills. Eyes: No visual changes. ENT: No sore throat. No congestion or rhinorrhea. Cardiovascular: Denies chest pain. Denies palpitations. Respiratory: Denies shortness of breath.  No cough. Gastrointestinal: No abdominal pain.  No nausea, no vomiting.  No diarrhea.  No constipation. Genitourinary: Negative for  dysuria. Musculoskeletal: Negative for back pain. Skin: Negative for rash. Neurological: Negative for headaches. No focal numbness, tingling or weakness.  Psychiatric:Positive SI without plan.  No HI or hallucinations.  ____________________________________________   PHYSICAL EXAM:  VITAL SIGNS: ED Triage Vitals  Enc Vitals Group     BP 05/21/17 2141 (!) 137/96     Pulse Rate 05/21/17 2141 84     Resp 05/21/17 2141 16     Temp 05/21/17 2141 98.6 F (37 C)     Temp Source 05/21/17 2141 Oral     SpO2 05/21/17 2141 95 %     Weight 05/21/17 2134 190 lb (86.2 kg)     Height 05/21/17 2134 5\' 6"  (1.676 m)     Head Circumference --      Peak Flow --      Pain Score --      Pain Loc --      Pain Edu? --      Excl. in [place]? --     Constitutional: Alert and oriented. Well appearing and in no acute distress. Answers questions appropriately. Eyes: Conjunctivae are normal.  EOMI. No scleral icterus. Head: Atraumatic. Nose: No congestion/rhinnorhea. Mouth/Throat: Mucous membranes are moist.  Neck: No stridor.  Supple.   Cardiovascular: Normal rate, regular rhythm. No murmurs, rubs or gallops.  Respiratory: Normal respiratory effort.  No accessory muscle use or retractions. Lungs CTAB.  No wheezes, rales or ronchi. Musculoskeletal: Moves all extremities well. Neurologic:  A&Ox3.  Speech is clear.  Face and smile are symmetric.  EOMI.  Moves all extremities well. Skin:  Skin is warm, dry and intact. No  rash noted. Psychiatric: Patient has a depressed mood with a flat affect.  She has normal speech and good insight.  She denies any homicidal ideations or hallucinations.  She denies any active suicidality or plan.  ____________________________________________   LABS (all labs ordered are listed, but only abnormal results are displayed)  Labs Reviewed  COMPREHENSIVE METABOLIC PANEL - Abnormal; Notable for the following components:      Result Value   Glucose, Bld 105 (*)    BUN 21 (*)     AST 14 (*)    ALT 10 (*)    All other components within normal limits  ETHANOL  CBC  SALICYLATE LEVEL  ACETAMINOPHEN LEVEL  URINE DRUG SCREEN, QUALITATIVE ([facility] ONLY)  HCG, QUANTITATIVE, PREGNANCY  POC URINE PREG, ED  POCT PREGNANCY, URINE   ____________________________________________  EKG  Not indicated ____________________________________________  RADIOLOGY  No results found.  ____________________________________________   PROCEDURES  Procedure(s) performed: None  Procedures  Critical Care performed: No ____________________________________________   INITIAL IMPRESSION / ASSESSMENT AND PLAN / ED COURSE  Pertinent labs & imaging results that were available during my care of the patient were reviewed by me and considered in my medical decision making (see chart for details).  27 y.o. female presenting with depression and stating that she did not want to be here anymore, suggesting suicidality.  The patient denies any active plan.  I will plan to place the patient under involuntary commitment until she is able to be assessed by a psychiatrist.  From a medical standpoint, she is hemodynamically stable and has no medical complaints.  She is cleared for psychiatric disposition at this time. ____________________________________________  FINAL CLINICAL IMPRESSION(S) / ED DIAGNOSES  Final diagnoses:  Depression, unspecified depression type  Suicidal ideation         NEW MEDICATIONS STARTED DURING THIS VISIT:  New Prescriptions   No medications on file       Eula Listen, MD 05/21/17 2232

## 2017-05-21 NOTE — BH Assessment (Signed)
TTS consult complete. Full assessment note to follow.

## 2017-05-21 NOTE — ED Notes (Signed)
Food/fluids were offered; Patient refused.

## 2017-05-21 NOTE — ED Notes (Signed)
Pt has belongings locked up and the key is number 14 and is in the pyxis.

## 2017-05-21 NOTE — ED Notes (Addendum)
Pt to the ER via [place] police for suicidal ideation. Pt says her mom called the police. Pt had an argument with her ex today. Pt says she and her ex split up a few months ago and she has had a difficult time with the situation and especially with today being valentines day, it made it even more stressful. Pt says that her and the ex have children together so they have to maintain some type of relationship.

## 2017-05-21 NOTE — ED Triage Notes (Signed)
Pt to the er for suicidal threat to EX over the phone. Pt states she said it out of anger. Pt states she told him she feels like she doesn't want to be here anymore.

## 2017-05-22 DIAGNOSIS — F330 Major depressive disorder, recurrent, mild: Secondary | ICD-10-CM

## 2017-05-22 DIAGNOSIS — F4325 Adjustment disorder with mixed disturbance of emotions and conduct: Secondary | ICD-10-CM

## 2017-05-22 LAB — HCG, QUANTITATIVE, PREGNANCY: hCG, Beta Chain, Quant, S: <1 m[IU]/mL (ref <5)

## 2017-05-22 NOTE — BH Assessment (Signed)
Assessment Note  Monica Moran is an 27 y.o. female who present to the ED via [employer]. Pt states that she is having a hard time dealing with a recent break up with the father of her children. Pt states that Valentine's Day was probably a trigger but that she has been "taking the break up pretty hard". "He left me for someone else and so me and the kids moved back in with my mom in September or October and I've just been taking it pretty hard. I guess maybe I'm taking it harder than most people. I cry a lot. I don't think I need to be here but the cops and my mom don't think that I am emotionally okay. I just want to be at home with my kids."   During the assessment the pt was soft spoken, calm, cooperative and pleasant to work with. She was sad and close to becoming tearful at times. Pt admits that the breakup has caused her to have some depression. She reports her symptoms as Isolating herself from others, not enjoying usual pleasures, frequent and uncontrollable crying spells. Pt denies any illicit drug or alcohol use. She reports having a poor appetite and decreased sleep resulting in only 3-4 hours of sleep nightly.   Pt denies SI/HI A/V H.   Diagnosis: Depression  Past Medical History: History reviewed. No pertinent past medical history.  History reviewed. No pertinent surgical history.  Family History:  Family History  Problem Relation Age of Onset  . Hypertension Paternal Grandmother   . Diabetes Paternal Grandmother     Social History:  reports that  has never smoked. she has never used smokeless tobacco. She reports that she does not drink alcohol or use drugs.  Additional Social History:  Alcohol / Drug Use Pain Medications: See MAR Prescriptions: See MAR Over the Counter: See MAR History of alcohol / drug use?: No history of alcohol / drug abuse(Pt denies use)  CIWA: CIWA-Ar BP: (!) 137/96 Pulse Rate: 84 COWS:    Allergies: No Known Allergies  Home  Medications:  (Not in a hospital admission)  OB/GYN Status:  No LMP recorded. Patient has had an implant.  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 : No Suicidal Intent: No Has patient had any suicidal intent within the past 6 months prior to admission? : No Is patient at risk for suicide?: Yes Suicidal Plan?: No Has patient had any suicidal plan within the past 6 months prior to admission? : No Access to Means: No What has been your use of drugs/alcohol within the last 12 months?: Pt denies Previous Attempts/Gestures: No How  many times?: 0 Other Self Harm Risks: Pt denies Triggers for Past Attempts: Other personal contacts(Break up with s/o) Intentional Self Injurious Behavior: None Family Suicide History: No Recent stressful life event(s): Conflict (Comment)(Break up) Persecutory voices/beliefs?: No Depression: Yes Depression Symptoms: Insomnia, Tearfulness, Isolating, Loss of interest in usual  pleasures, Feeling worthless/self pity Substance abuse history and/or treatment for substance abuse?: No Suicide prevention information given to non-admitted patients: Not applicable  Risk to Others within the past 6 months Homicidal Ideation: No Does patient have any lifetime risk of violence toward others beyond the six months prior to admission? : No Thoughts of Harm to Others: No Current Homicidal Intent: No Current Homicidal Plan: No Access to Homicidal Means: No Identified Victim: n/a History of harm to others?: No Assessment of Violence: None Noted Violent Behavior Description: n/a Does patient have access to weapons?: No Criminal Charges Pending?: No Does patient have a court date: No Is patient on probation?: No  Psychosis Hallucinations: None noted Delusions: None noted  Mental Status Report Appearance/Hygiene: In scrubs Eye Contact: Good Motor Activity: Freedom of movement Speech: Soft, Logical/coherent Level of Consciousness: Alert Mood: Depressed, Sad, Pleasant Affect: Depressed, Sad Anxiety Level: None Thought Processes: Coherent, Relevant Judgement: Unimpaired Orientation: Person, Place, Time, Situation, Appropriate for developmental age Obsessive Compulsive Thoughts/Behaviors: None  Cognitive Functioning Concentration: Normal Memory: Recent Intact, Remote Intact IQ: Average Insight: Good Impulse Control: Fair Appetite: Poor Weight Loss: 2 Weight Gain: 0 Sleep: Decreased Total Hours of Sleep: 4 Vegetative Symptoms: None  ADLScreening [facility] Assessment Services) Patient's cognitive ability adequate to safely complete daily activities?: Yes Patient able to express need for assistance with ADLs?: Yes Independently performs ADLs?: Yes (appropriate for developmental age)  Prior Inpatient Therapy Prior Inpatient Therapy: No  Prior Outpatient Therapy Prior Outpatient Therapy: No Does patient have an ACCT team?: No Does patient have Intensive  In-House Services?  : No Does patient have [facility] services? : No Does patient have P4CC services?: No  ADL Screening (condition at time of admission) Patient's cognitive ability adequate to safely complete daily activities?: Yes Is the patient deaf or have difficulty hearing?: No Does the patient have difficulty seeing, even when wearing glasses/contacts?: No Does the patient have difficulty concentrating, remembering, or making decisions?: No Patient able to express need for assistance with ADLs?: Yes Does the patient have difficulty dressing or bathing?: No Independently performs ADLs?: Yes (appropriate for developmental age) Does the patient have difficulty walking or climbing stairs?: No Weakness of Legs: None Weakness of Arms/Hands: None  Home Assistive Devices/Equipment Home Assistive Devices/Equipment: None  Therapy Consults (therapy consults require a physician order) PT Evaluation Needed: No OT Evalulation Needed: No SLP Evaluation Needed: No Abuse/Neglect Assessment (Assessment to be complete while patient is alone) Abuse/Neglect Assessment Can Be Completed: Yes Physical Abuse: Denies Verbal Abuse: Denies Sexual Abuse: Denies Exploitation of patient/patient's resources: Denies Self-Neglect: Denies Values / Beliefs Cultural Requests During Hospitalization: None Spiritual Requests During Hospitalization: None Consults Spiritual Care Consult Needed: No Social Work Consult Needed: No Regulatory affairs officer (For Healthcare) Does Patient Have a Medical Advance Directive?: No    Additional Information 1:1 In Past 12 Months?: No CIRT Risk: No Elopement Risk: No Does patient have medical clearance?: Yes  Child/Adolescent Assessment Running Away Risk: Denies Bed-Wetting: Denies Destruction of Property: Denies Cruelty to Animals: Denies Stealing: Denies Rebellious/Defies Authority: Denies Satanic Involvement: Denies Science writer: Denies Problems at [employer]:  Denies Gang Involvement: Denies  Disposition:  Disposition Initial Assessment Completed for this Encounter: Yes Disposition of  Patient: Pending Review with psychiatrist  On Site Evaluation by:   Reviewed with Physician:    Oree Mirelez D Tequisha Maahs 05/22/2017 12:16 AM

## 2017-05-22 NOTE — Consult Note (Signed)
[facility]iat[facility] Consult   Reason for Consult: Consult for 27 year old woman brought here under IVC for alleged suicidal ideation Referring Physician: Quale Patient Identification: Monica Moran MRN:  [id number] Principal Diagnosis: Adjustment disorder with mixed disturbance of emotions and conduct Diagnosis:   Patient Active Problem List   Diagnosis Date Noted  . Adjustment disorder with mixed disturbance of emotions and conduct [F43.25] 05/22/2017  . 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 Uses contraceptive implants for birth control [Z78.9] 06/13/2016  . 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 female patient admitted with "the cops made me calm".  HPI: Patient interviewed chart reviewed.  27 year old woman brought in by police last night.  Family had called police because they were worried about the patient's mood.  Patient admits that she was sitting in her car crying inconsolably and that she had made a comment about "not wanting to be here" but denies that she had any suicidal thought or intent.  Major stress yesterday was that it was Valentine's Day and her ex-partner was behaving in a emotionally manipulative way that got her upset.  Patient denies any suicidal thought.  Denies any psychosis denies homicidal ideation.  She had not been drinking or using any drugs.  Other than yesterday she says her mood is generally functional although she does feel down fairly often.  Cries from time to time.  Appetite is not as good as it could be.  Sleeps pretty adequately.  Does not have any psychotic symptoms or suicidal thoughts.  All of her sadness flows from how upset she is that her long-term boyfriend abandon her several months ago for another woman.  Social history: Patient originally from [place] but has been living here and [facility] for a couple years.   Lives with her mother.  Works in a daycare.  Has 2 young children ages 83 and 26 that she takes care of.  Medical history: No significant medical problems  Substance abuse history: Denies any alcohol or drug use current or past  Past Psychiatric History: No history of psychiatric hospitalization no history of any treatment in fact.  Never seen a counselor or therapist.  Never been in a psychiatric hospital or on psychiatric medicine.  No history of suicide attempts or self injury or violence.  Risk to Self: Suicidal Ideation: No Suicidal Intent: No Is patient at risk for suicide?: Yes Suicidal Plan?: No Access to Means: No What has been your use of drugs/alcohol within the last 12 months?: Pt denies How many times?: 0 Other Self Harm Risks: Pt denies Triggers for Past Attempts: Other personal contacts(Break up with s/o) Intentional Self Injurious Behavior: None Risk to Others: Homicidal Ideation: No Thoughts of Harm to Others: No Current Homicidal Intent: No Current Homicidal Plan: No Access to Homicidal Means: No Identified Victim: n/a History of harm to others?: No Assessment of Violence: None Noted Violent Behavior Description: n/a Does patient have access to weapons?: No Criminal Charges Pending?: No Does patient have a court date: No Prior Inpatient Therapy: Prior Inpatient Therapy: No Prior Outpatient Therapy: Prior Outpatient Therapy: No Does patient have an ACCT team?: No Does patient have Intensive In-House Services?  : No Does patient have [facility] services? : No Does patient have P4CC services?: No  Past Medical History: History reviewed. No pertinent past medical history. History reviewed. No pertinent surgical history. Family History:  Family History  Problem Relation Age of Onset  . Hypertension Paternal Grandmother   . Diabetes Paternal Grandmother    Family Psychiatric  History: Denies any family history Social History:  Social History   Substance and  Sexual Activity  Alcohol Use No  . Frequency: Never     Social History   Substance and Sexual Activity  Drug Use No    Social History   Socioeconomic History  . Marital status: Single    Spouse name: None  . Number of children: 2  . Years of education: None  . Highest education level: None  Social Needs  . Financial resource strain: None  . Food insecurity - worry: None  . Food insecurity - inability: None  . Transportation needs - medical: None  . Transportation needs - non-medical: None  Occupational History  . None  Tobacco Use  . Smoking status: Never Smoker  . Smokeless tobacco: Never Used  Substance and Sexual Activity  . Alcohol use: No    Frequency: Never  . Drug use: No  . Sexual activity: No    Birth control/protection: Implant    Comment: nexplanon  Other Topics Concern  . None  Social History Narrative  . None   Additional Social History:    Allergies:  No Known Allergies  Labs:  Results for orders placed or performed during the hospital encounter of 05/21/17 (from the past 48 hour(s))  Comprehensive metabolic panel     Status: Abnormal   Collection Time: 05/21/17  9:38 PM  Result Value Ref Range   Sodium 139 135 - 145 mmol/L   Potassium 3.9 3.5 - 5.1 mmol/L   Chloride 107 101 - 111 mmol/L   CO2 24 22 - 32 mmol/L   Glucose, Bld 105 (H) 65 - 99 mg/dL   BUN 21 (H) 6 - 20 mg/dL   Creatinine, Ser 0.74 0.44 - 1.00 mg/dL   Calcium 9.3 8.9 - 10.3 mg/dL   Total Protein 7.7 6.5 - 8.1 g/dL   Albumin 4.2 3.5 - 5.0 g/dL   AST 14 (L) 15 - 41 U/L   ALT 10 (L) 14 - 54 U/L   Alkaline Phosphatase 58 38 - 126 U/L   Total Bilirubin 0.5 0.3 - 1.2 mg/dL   GFR calc non Af Amer >60 >60 mL/min   GFR calc Af Amer >60 >60 mL/min    Comment: (NOTE) The eGFR has been calculated using the CKD EPI equation. This calculation has not been validated in all clinical situations. eGFR's persistently <60 mL/min signify possible Chronic Kidney Disease.    Anion gap 8 5 -  15    Comment: Performed at [facility], [address] [place], [place] [address]  Ethanol     Status: None   Collection Time: 05/21/17  9:38 PM  Result Value Ref Range   Alcohol, Ethyl (B) <10 <10 mg/dL    Comment:        LOWEST DETECTABLE LIMIT FOR SERUM ALCOHOL IS 10 mg/dL FOR MEDICAL PURPOSES ONLY Performed at [facility], [address] [place], [place] [address]   Salicylate level     Status: None   Collection Time: 05/21/17  9:38 PM  Result Value Ref Range   Salicylate Lvl <1.8 2.8 - 30.0 mg/dL    Comment: Performed at [facility], [address] [place], [place] [address]  Acetaminophen level     Status: Abnormal   Collection Time: 05/21/17  9:38 PM  Result  Value Ref Range   Acetaminophen (Tylenol), Serum <10 (L) 10 - 30 ug/mL    Comment:        THERAPEUTIC CONCENTRATIONS VARY SIGNIFICANTLY. A RANGE OF 10-30 ug/mL MAY BE AN EFFECTIVE CONCENTRATION FOR MANY PATIENTS. HOWEVER, SOME ARE BEST TREATED AT CONCENTRATIONS OUTSIDE THIS RANGE. ACETAMINOPHEN CONCENTRATIONS >150 ug/mL AT 4 HOURS AFTER INGESTION AND >50 ug/mL AT 12 HOURS AFTER INGESTION ARE OFTEN ASSOCIATED WITH TOXIC REACTIONS. Performed at [facility], [address] [place], [place] [address]   cbc     Status: None   Collection Time: 05/21/17  9:38 PM  Result Value Ref Range   WBC 7.4 3.6 - 11.0 K/uL   RBC 4.54 3.80 - 5.20 MIL/uL   Hemoglobin 13.7 12.0 - 16.0 g/dL   HCT 40.4 35.0 - 47.0 %   MCV 88.9 80.0 - 100.0 fL   MCH 30.1 26.0 - 34.0 pg   MCHC 33.9 32.0 - 36.0 g/dL   RDW 13.4 11.5 - 14.5 %   Platelets 268 150 - 440 K/uL    Comment: Performed at [facility], [address] [place], [place] [address]  hCG, quantitative, pregnancy     Status: None   Collection Time: 05/21/17  9:38 PM  Result Value Ref Range   hCG, Beta Chain, Quant, S <1 <5 mIU/mL    Comment:          GEST. AGE      CONC.  (mIU/mL)   <=1 WEEK        5 - 50     2 WEEKS        50 - 500     3 WEEKS       100 - 10,000     4 WEEKS     1,000 - 30,000     5 WEEKS     3,500 - 115,000   6-8 WEEKS     12,000 - 270,000    12 WEEKS     15,000 - 220,000        FEMALE AND NON-PREGNANT FEMALE:     LESS THAN 5 mIU/mL Performed at [facility], [address] [place], [place] [address]   Urine Drug Screen, Qualitative     Status: None   Collection Time: 05/21/17  9:40 PM  Result Value Ref Range   Tricyclic, Ur Screen NONE DETECTED NONE DETECTED   Amphetamines, Ur Screen NONE DETECTED NONE DETECTED   MDMA (Ecstasy)Ur Screen NONE DETECTED NONE DETECTED   Cocaine Metabolite,Ur [place] NONE DETECTED NONE DETECTED   Opiate, Ur Screen NONE DETECTED NONE DETECTED   Phencyclidine (PCP) Ur S NONE DETECTED NONE DETECTED   Cannabinoid 50 Ng, Ur [place] NONE DETECTED NONE DETECTED   Barbiturates, Ur Screen NONE DETECTED NONE DETECTED   Benzodiazepine, Ur Scrn NONE DETECTED NONE DETECTED   Methadone Scn, Ur NONE DETECTED NONE DETECTED    Comment: (NOTE) Tricyclics + metabolites, urine    Cutoff 1000 ng/mL Amphetamines + metabolites, urine  Cutoff 1000 ng/mL MDMA (Ecstasy), urine              Cutoff 500 ng/mL Cocaine Metabolite, urine          Cutoff 300 ng/mL Opiate + metabolites, urine        Cutoff 300 ng/mL Phencyclidine (PCP), urine         Cutoff 25 ng/mL Cannabinoid, urine                 Cutoff  50 ng/mL Barbiturates + metabolites, urine  Cutoff 200 ng/mL Benzodiazepine, urine              Cutoff 200 ng/mL Methadone, urine                   Cutoff 300 ng/mL The urine drug screen provides only a preliminary, unconfirmed analytical test result and should not be used for non-medical purposes. Clinical consideration and professional judgment should be applied to any positive drug screen result due to possible interfering substances. A more specific alternate chemical method must be used in order to obtain a confirmed analytical result. Gas chromatography / mass  spectrometry (GC/MS) is the preferred confirmat ory method. Performed at [facility], [address] [place], [place] [address]   Pregnancy, urine POC     Status: None   Collection Time: 05/21/17  9:59 PM  Result Value Ref Range   Preg Test, Ur NEGATIVE NEGATIVE    Comment:        THE SENSITIVITY OF THIS METHODOLOGY IS >24 mIU/mL     No current facility-administered medications for this encounter.    Current Outpatient Medications  Medication Sig Dispense Refill  . etonogestrel (NEXPLANON) 68 MG IMPL implant 1 each by Subdermal route once.      Musculoskeletal: Strength & Muscle Tone: within normal limits Gait & Station: normal Patient leans: N/A  Psychiatric Specialty Exam: Physical Exam  Nursing note and vitals reviewed. Constitutional: She appears well-developed and well-nourished.  HENT:  Head: Normocephalic and atraumatic.  Eyes: Conjunctivae are normal. Pupils are equal, round, and reactive to light.  Neck: Normal range of motion.  Cardiovascular: Regular rhythm and normal heart sounds.  Respiratory: Effort normal. No respiratory distress.  GI: Soft.  Musculoskeletal: Normal range of motion.  Neurological: She is alert.  Skin: Skin is warm and dry.  Psychiatric: She has a normal mood and affect. Her speech is normal and behavior is normal. Judgment and thought content normal. Cognition and memory are normal.    Review of Systems  Constitutional: Negative.   HENT: Negative.   Eyes: Negative.   Respiratory: Negative.   Cardiovascular: Negative.   Gastrointestinal: Negative.   Musculoskeletal: Negative.   Skin: Negative.   Neurological: Negative.   Psychiatric/Behavioral: Negative for depression, hallucinations, memory loss, substance abuse and suicidal ideas. The patient is not nervous/anxious and does not have insomnia.     Blood pressure (!) 137/96, pulse 84, temperature 98.6 F (37 C), temperature source Oral, resp. rate 16, height '5\' 6"'   (1.676 m), weight 86.2 kg (190 lb), SpO2 95 %.Body mass index is 30.67 kg/m.  General Appearance: Casual  Eye Contact:  Good  Speech:  Clear and Coherent  Volume:  Normal  Mood:  Euthymic  Affect:  Constricted  Thought Process:  Goal Directed  Orientation:  Full (Time, Place, and Person)  Thought Content:  Logical  Suicidal Thoughts:  No  Homicidal Thoughts:  No  Memory:  Immediate;   Good Recent;   Good Remote;   Good  Judgement:  Good  Insight:  Good  Psychomotor Activity:  Normal  Concentration:  Concentration: Good  Recall:  Good  Fund of Knowledge:  Good  Language:  Good  Akathisia:  No  Handed:  Right  AIMS (if indicated):     Assets:  Communication Skills Desire for Improvement Financial Resources/Insurance Housing Physical Health Resilience Social Support  ADL's:  Intact  Cognition:  WNL  Sleep:  Treatment Plan Summary: Plan 27 year old woman who had a bad day yesterday for pretty obvious reasons.  Denies that she ever had any suicidal intention.  No evidence that she did anything to harm herself.  Patient is currently calm and lucid.  Does not meet commitment criteria.  Discontinue IVC.  Counseled patient about the availability of therapy and psychiatric treatment in the community.  Encouraged her to consider seeking out treatment through [facility].  Supportive counseling done.  Labs all reviewed.  Case reviewed with ER physician and TTS and nursing.  Disposition: No evidence of imminent risk to self or others at present.   Patient does not meet criteria for psychiatric inpatient admission. Supportive therapy provided about ongoing stressors. Discussed crisis plan, support from social network, calling 911, coming to the Emergency Department, and calling Suicide Hotline.  Alethia Berthold, MD 05/22/2017 2:27 PM

## 2017-05-22 NOTE — ED Provider Notes (Signed)
Dr. Weber Cooks is seen and evaluated the patient.  Reports he has rescinded the patient's IVC, he advises discharge with voluntary outpatient follow-up.  Currently not risk to herself or others, recommends discharge without change in any medication regimen.   Delman Kitten, MD 05/22/17 1414

## 2017-05-22 NOTE — Discharge Instructions (Signed)

## 2017-06-02 ENCOUNTER — Ambulatory Visit (INDEPENDENT_AMBULATORY_CARE_PROVIDER_SITE_OTHER): Payer: Self-pay | Admitting: Internal Medicine

## 2017-06-02 ENCOUNTER — Encounter: Payer: Self-pay | Admitting: Internal Medicine

## 2017-06-02 VITALS — BP 120/78 | HR 74 | Ht 66.0 in | Wt 190.0 lb

## 2017-06-02 DIAGNOSIS — N3000 Acute cystitis without hematuria: Secondary | ICD-10-CM

## 2017-06-02 LAB — POC URINALYSIS WITH MICROSCOPIC (NON AUTO)MANUAL RESULT
Bilirubin, UA: NEGATIVE
Blood, UA: NEGATIVE
Crystals: 0
Glucose, UA: NEGATIVE
Ketones, UA: NEGATIVE
MUCUS UA: 0
Nitrite, UA: NEGATIVE
PH UA: 6 (ref 5.0–8.0)
PROTEIN UA: NEGATIVE
RBC: 0 M/uL — AB (ref 4.04–5.48)
SPEC GRAV UA: 1.015 (ref 1.010–1.025)
Urobilinogen, UA: 0.2 E.U./dL
WBC CASTS UA: 5

## 2017-06-02 NOTE — Patient Instructions (Signed)

## 2017-06-02 NOTE — Progress Notes (Signed)
    Date:  06/02/2017   Name:  Monica Moran   DOB:  June 21, 1990   MRN:  [id number]   Chief Complaint: Urinary Tract Infection (Started 2 days ago. Burning and frequent urination. Took AZO first. No help. Took old antibiotics and no longer had symptoms. Unsure if still have infection. )  Urinary Tract Infection   This is a new problem. The current episode started in the past 7 days. The problem has been resolved. The patient is experiencing no pain. There has been no fever. Pertinent negatives include no chills, frequency, hematuria, nausea, urgency or vomiting. She has tried antibiotics (took an old antibiotic - name unknown) for the symptoms. The treatment provided significant relief.     Review of Systems  Constitutional: Negative for chills, fatigue and fever.  Respiratory: Negative for chest tightness and shortness of breath.   Cardiovascular: Negative for chest pain.  Gastrointestinal: Negative for diarrhea, nausea and vomiting.  Genitourinary: Negative for dysuria, frequency, hematuria and urgency.  Musculoskeletal: Negative for back pain.    Patient Active Problem List   Diagnosis Date Noted  . 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 Mild recurrent major depression ([place]) 05/22/2017  . Uses contraceptive implants for birth control 06/13/2016  . 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 Authorizing Provider  etonogestrel (NEXPLANON) 68 MG IMPL implant 1 each by Subdermal route once.   Yes [provider]    No Known Allergies  History reviewed. No pertinent surgical history.  Social History   Tobacco Use  . Smoking status: Never Smoker  . Smokeless tobacco: Never Used  Substance Use Topics  . Alcohol use: No    Frequency: Never  . Drug use: No     Medication list has been reviewed and updated.  PHQ 2/9 Scores 06/02/2017 04/27/2017  PHQ -  2 Score 0 0  PHQ- 9 Score 0 -    Physical Exam  Constitutional: She appears well-developed and well-nourished.  Cardiovascular: Normal rate, regular rhythm and normal heart sounds.  Pulmonary/Chest: Effort normal and breath sounds normal. No respiratory distress.  Abdominal: Soft. Bowel sounds are normal. There is no tenderness. There is no rebound, no guarding and no CVA tenderness.  Psychiatric: She has a normal mood and affect.  Nursing note and vitals reviewed.   BP 120/78   Pulse 74   Ht 5\' 6"  (1.676 m)   Wt 190 lb (86.2 kg)   SpO2 100%   BMI 30.67 kg/m   Assessment and Plan: 1. Acute cystitis without hematuria Appears treated If sx recur, will send in antibiotics; otherwise f/u if needed - POC urinalysis w microscopic (non auto)   No orders of the defined types were placed in this encounter.   Partially dictated using Editor, commissioning. Any errors are unintentional.  Halina Maidens, MD [facility]  06/02/2017

## 2017-07-06 ENCOUNTER — Encounter (INDEPENDENT_AMBULATORY_CARE_PROVIDER_SITE_OTHER): Payer: Self-pay

## 2017-08-05 ENCOUNTER — Other Ambulatory Visit: Payer: Self-pay

## 2017-08-05 ENCOUNTER — Ambulatory Visit
Admission: EM | Admit: 2017-08-05 | Discharge: 2017-08-05 | Disposition: A | Discharge status: Home / Self Care | Payer: Self-pay | Attending: Family Medicine | Admitting: Family Medicine

## 2017-08-05 DIAGNOSIS — N390 Urinary tract infection, site not specified: Secondary | ICD-10-CM

## 2017-08-05 DIAGNOSIS — R319 Hematuria, unspecified: Secondary | ICD-10-CM

## 2017-08-05 LAB — URINALYSIS, COMPLETE (UACMP) WITH MICROSCOPIC
BILIRUBIN URINE: NEGATIVE
GLUCOSE, UA: NEGATIVE mg/dL
NITRITE: NEGATIVE
PROTEIN: NEGATIVE mg/dL
Specific Gravity, Urine: 1.02 (ref 1.005–1.030)
pH: 7 (ref 5.0–8.0)

## 2017-08-05 MED ORDER — SULFAMETHOXAZOLE-TRIMETHOPRIM 800-160 MG PO TABS: 1.0000 | ORAL_TABLET | Freq: Two times a day (BID) | ORAL | 0 refills | Status: AC

## 2017-08-05 NOTE — Discharge Instructions (Addendum)
Take medication as prescribed. Rest. Drink plenty of fluids.  ° °Follow up with your primary care physician this week as needed. Return to Urgent care for new or worsening concerns.  ° °

## 2017-08-05 NOTE — ED Provider Notes (Signed)
[facility] ____________________________________________  Time seen: Approximately 4132 PM  I have reviewed the triage vital signs and the nursing notes.   HISTORY  Chief Complaint Urinary Frequency   HPI Monica Moran is a 27 y.o. female presented for evaluation of dysuria has been present today.  Patient reports that while at work today she has been noticing urinary frequency, urinary urgency and some burning with urination.  States some suprapubic pressure, but denies abdominal pain.  No accompanying back pain, fevers, vomiting or diarrhea.  Reports she has had 2 urinary tract infections in the past with similar presentation.  Denies recent sickness or recent antibiotic use.  Reports possible trigger and is not drinking enough water as well as not urinating right when needed.  Reports sexually active with the same partner, declines concerns of STDs.  Denies any vaginal discharge, vaginal odor vaginal pain.  Continues to eat and drink well.  Reports otherwise feels well.  No alleviating measures attempted prior to arrival.  No LMP recorded. Patient has had an implant. Denies pregnancy.   [name], MD: PCP   History reviewed. No pertinent past medical history.  Patient Active Problem List   Diagnosis Date Noted  . 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 Mild recurrent major depression ([place]) 05/22/2017  . Uses contraceptive implants for birth control 06/13/2016  . 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 NO PAST SURGERIES       No current facility-administered medications for this encounter.   Current Outpatient Medications:  .  etonogestrel (NEXPLANON) 68 MG IMPL implant, 1 each by Subdermal route once., Disp: , Rfl:  .  sulfamethoxazole-trimethoprim (BACTRIM DS,SEPTRA DS) 800-160 MG tablet, Take 1 tablet by mouth 2 (two) times daily for 3 days., Disp: 6  tablet, Rfl: 0  Allergies Patient has no known allergies.  Family History  Problem Relation Age of Onset  . Hypertension Paternal Grandmother   . Diabetes Paternal Grandmother     Social History Social History   Tobacco Use  . Smoking status: Never Smoker  . Smokeless tobacco: Never Used  Substance Use Topics  . Alcohol use: No    Frequency: Never  . Drug use: No    Review of Systems Constitutional: No fever/chills Eyes: No visual changes. ENT: No sore throat. Cardiovascular: Denies chest pain. Respiratory: Denies shortness of breath. Gastrointestinal: No abdominal pain.  No nausea, no vomiting.  No diarrhea.   Genitourinary: positive for dysuria. Musculoskeletal: Negative for back pain. Skin: Negative for rash.   ____________________________________________   PHYSICAL EXAM:  VITAL SIGNS: ED Triage Vitals  Enc Vitals Group     BP 08/05/17 1325 (!) 116/93     Pulse Rate 08/05/17 1325 82     Resp 08/05/17 1325 17     Temp 08/05/17 1325 98.4 F (36.9 C)     Temp Source 08/05/17 1325 Oral     SpO2 08/05/17 1325 100 %     Weight 08/05/17 1323 180 lb (81.6 kg)     Height --      Head Circumference --      Peak Flow --      Pain Score 08/05/17 1322 6     Pain Loc --      Pain Edu? --      Excl. in [place]? --     Constitutional: Alert and oriented. Well appearing and in no acute distress. ENT  Head: Normocephalic and atraumatic. Cardiovascular: Normal rate, regular rhythm. Grossly normal heart sounds.  Good peripheral circulation. Respiratory: Normal respiratory effort without tachypnea nor retractions. Breath sounds are clear and equal bilaterally. No wheezes, rales, rhonchi. Gastrointestinal: Soft and nontender. No CVA tenderness. Musculoskeletal:  No midline cervical, thoracic or lumbar tenderness to palpation.  Neurologic:  Normal speech and language. Speech is normal. No gait instability.  Skin:  Skin is warm, dry and intact. No rash  noted. Psychiatric: Mood and affect are normal. Speech and behavior are normal. Patient exhibits appropriate insight and judgment   ___________________________________________   LABS (all labs ordered are listed, but only abnormal results are displayed)  Labs Reviewed  URINALYSIS, COMPLETE (UACMP) WITH MICROSCOPIC - Abnormal; Notable for the following components:      Result Value   APPearance HAZY (*)    Hgb urine dipstick MODERATE (*)    Ketones, ur TRACE (*)    Leukocytes, UA MODERATE (*)    Bacteria, UA FEW (*)    All other components within normal limits  URINE CULTURE   ____________________________________________   PROCEDURES Procedures    INITIAL IMPRESSION / ASSESSMENT AND PLAN / ED COURSE  Pertinent labs & imaging results that were available during my care of the patient were reviewed by me and considered in my medical decision making (see chart for details).  Well appearing. No acute distress. Urinalysis reviewed, suspect uti. Will await culture, and start on oral bactrim. Encourage rest, fluids, supportive care. Discussed indication, risks and benefits of medications with patient.  Discussed follow up with Primary care physician this week as needed. Discussed follow up and return parameters including no resolution or any worsening concerns. Patient verbalized understanding and agreed to plan.   ____________________________________________   FINAL CLINICAL IMPRESSION(S) / ED DIAGNOSES  Final diagnoses:  Urinary tract infection with hematuria, site unspecified     ED Discharge Orders        Ordered    sulfamethoxazole-trimethoprim (BACTRIM DS,SEPTRA DS) 800-160 MG tablet  2 times daily     08/05/17 1359       Note: This dictation was prepared with Dragon dictation along with smaller phrase technology. Any transcriptional errors that result from this process are unintentional.         Marylene Land, NP 08/05/17 1516

## 2017-08-05 NOTE — ED Triage Notes (Signed)
Patient complains of urinary frequency with decreased output with bladder pressure x 2 hours.

## 2017-08-07 LAB — URINE CULTURE

## 2017-09-14 ENCOUNTER — Ambulatory Visit: Payer: BC Managed Care – PPO | Admitting: Obstetrics & Gynecology

## 2017-09-29 ENCOUNTER — Encounter: Payer: Medicaid Other | Admitting: Internal Medicine

## 2017-10-29 ENCOUNTER — Encounter: Payer: Self-pay | Admitting: Gynecology

## 2017-10-29 ENCOUNTER — Ambulatory Visit
Admission: EM | Admit: 2017-10-29 | Discharge: 2017-10-29 | Disposition: A | Discharge status: Home / Self Care | Payer: Self-pay | Attending: Family Medicine | Admitting: Family Medicine

## 2017-10-29 ENCOUNTER — Other Ambulatory Visit: Payer: Self-pay

## 2017-10-29 DIAGNOSIS — J029 Acute pharyngitis, unspecified: Secondary | ICD-10-CM | POA: Insufficient documentation

## 2017-10-29 DIAGNOSIS — R509 Fever, unspecified: Secondary | ICD-10-CM

## 2017-10-29 DIAGNOSIS — M791 Myalgia, unspecified site: Secondary | ICD-10-CM

## 2017-10-29 LAB — RAPID STREP SCREEN (MED CTR MEBANE ONLY): STREPTOCOCCUS, GROUP A SCREEN (DIRECT): NEGATIVE

## 2017-10-29 MED ORDER — AMOXICILLIN 500 MG PO TABS: 500.0000 mg | ORAL_TABLET | Freq: Two times a day (BID) | ORAL | 0 refills | Status: DC

## 2017-10-29 NOTE — ED Triage Notes (Signed)
Patient c/o x 1 week ago with cold symptoms. Pt. Stated x yesterday with sore throat and a fever of 100.4.

## 2017-10-29 NOTE — Discharge Instructions (Signed)
Antibiotic as prescribed.  Take care  Dr. Calle Schader  

## 2017-10-29 NOTE — ED Provider Notes (Signed)
[facility]    CSN: [id number] Arrival date & time: 10/29/17  9150  History   Chief Complaint Chief Complaint  Patient presents with  . Sore Throat  . Fever   HPI  27 year old female presents with sore throat, and fever.  Patient reports that she has not been feeling well for the past week.  She states that she has had sore throat and stuffy nose.  She states that she was doing fine until last night when she developed sudden onset fever, body aches, headache and worsening sore throat.  She reports difficulty swallowing.  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  No medications or interventions tried.  Her symptoms are currently severe.  No known relieving factors.  No other associated symptoms.  No other complaints.  PMH: Patient Active Problem List   Diagnosis Date Noted  . 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 Mild recurrent major depression ([place]) 05/22/2017  . Uses contraceptive implants for birth control 06/13/2016  . 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 NO PAST SURGERIES      OB History    Gravida  2   Para  2   Term  0   Preterm      AB      Living  2     SAB      TAB      Ectopic      Multiple      Live Births             Home Medications    Prior to Admission medications   Medication Sig Start Date End Date Taking? Authorizing Provider  etonogestrel (NEXPLANON) 68 MG IMPL implant 1 each by Subdermal route once.   Yes [provider]  amoxicillin (AMOXIL) 500 MG tablet Take 1 tablet (500 mg total) by mouth 2 (two) times daily. 10/29/17   Coral Spikes, DO    Family History Family History  Problem Relation Age of Onset  . Hypertension Paternal Grandmother   . Diabetes Paternal Grandmother     Social History Social History   Tobacco Use  . Smoking status: Never Smoker  . Smokeless tobacco: Never Used    Substance Use Topics  . Alcohol use: No    Frequency: Never  . Drug use: No     Allergies   Patient has no known allergies.   Review of Systems Review of Systems  Constitutional: Positive for fever.  HENT: Positive for sore throat.   Musculoskeletal:       Body aches.  Neurological: Positive for headaches.   Physical Exam Triage Vital Signs ED Triage Vitals  Enc Vitals Group     BP 10/29/17 0908 (!) 123/94     Pulse Rate 10/29/17 0908 86     Resp 10/29/17 0908 16     Temp 10/29/17 0908 98.7 F (37.1 C)     Temp Source 10/29/17 0908 Oral     SpO2 10/29/17 0908 100 %     Weight 10/29/17 0906 180 lb (81.6 kg)     Height 10/29/17 0906 5\' 4"  (1.626 m)     Head Circumference --      Peak Flow --      Pain Score --      Pain Loc --      Pain Edu? --    Updated Vital Signs BP (!) 123/94 (BP  Location: Left Arm)   Pulse 86   Temp 98.7 F (37.1 C) (Oral)   Resp 16   Ht 5\' 4"  (1.626 m)   Wt 180 lb (81.6 kg)   LMP 09/29/2017   SpO2 100%   BMI 30.90 kg/m   Visual Acuity Right Eye Distance:   Left Eye Distance:   Bilateral Distance:    Right Eye Near:   Left Eye Near:    Bilateral Near:     Physical Exam  Constitutional: She is oriented to person, place, and time. She appears well-developed. No distress.  HENT:  Head: Normocephalic and atraumatic.  Oropharynx with tonsillar swelling, erythema, and exudate.  Cardiovascular: Normal rate and regular rhythm.  Pulmonary/Chest: Effort normal and breath sounds normal. She has no wheezes. She has no rales.  Neurological: She is alert and oriented to person, place, and time.  Psychiatric: She has a normal mood and affect. Her behavior is normal.  Nursing note and vitals reviewed.  UC Treatments / Results  Labs (all labs ordered are listed, but only abnormal results are displayed) Labs Reviewed  RAPID STREP SCREEN ([facility] ONLY)  CULTURE, GROUP A STREP [facility])    EKG None  Radiology No results  found.  Procedures Procedures (including critical care time)  Medications Ordered in UC Medications - No data to display  Initial Impression / Assessment and Plan / UC Course  I have reviewed the triage vital signs and the nursing notes.  Pertinent labs & imaging results that were available during my care of the patient were reviewed by me and considered in my medical decision making (see chart for details).    27 year old female presents with pharyngitis.  Rapid strep was negative.  However, I clinically suspect strep pharyngitis.  Treating with amoxicillin while awaiting culture.  Final Clinical Impressions(s) / UC Diagnoses   Final diagnoses:  Pharyngitis, unspecified etiology     Discharge Instructions     Antibiotic as prescribed.  Take care  Dr. Lacinda Axon     ED Prescriptions    Medication Sig Dispense Auth. Provider   amoxicillin (AMOXIL) 500 MG tablet Take 1 tablet (500 mg total) by mouth 2 (two) times daily. 20 tablet Coral Spikes, DO     Controlled Substance Prescriptions [place] Controlled Substance Registry consulted? Not Applicable   Coral Spikes, [place] 10/29/17 2763

## 2017-10-31 LAB — CULTURE, GROUP A STREP (THRC)

## 2018-03-03 IMAGING — CR DG CHEST 2V
2 series · 2 of 2 positions shown · non-contrast
Comparison: None.

CLINICAL DATA: 26-year-old female with 2 days of mid chest pain and
shortness of breath.

EXAM:
CHEST  2 VIEW

[chest pa]
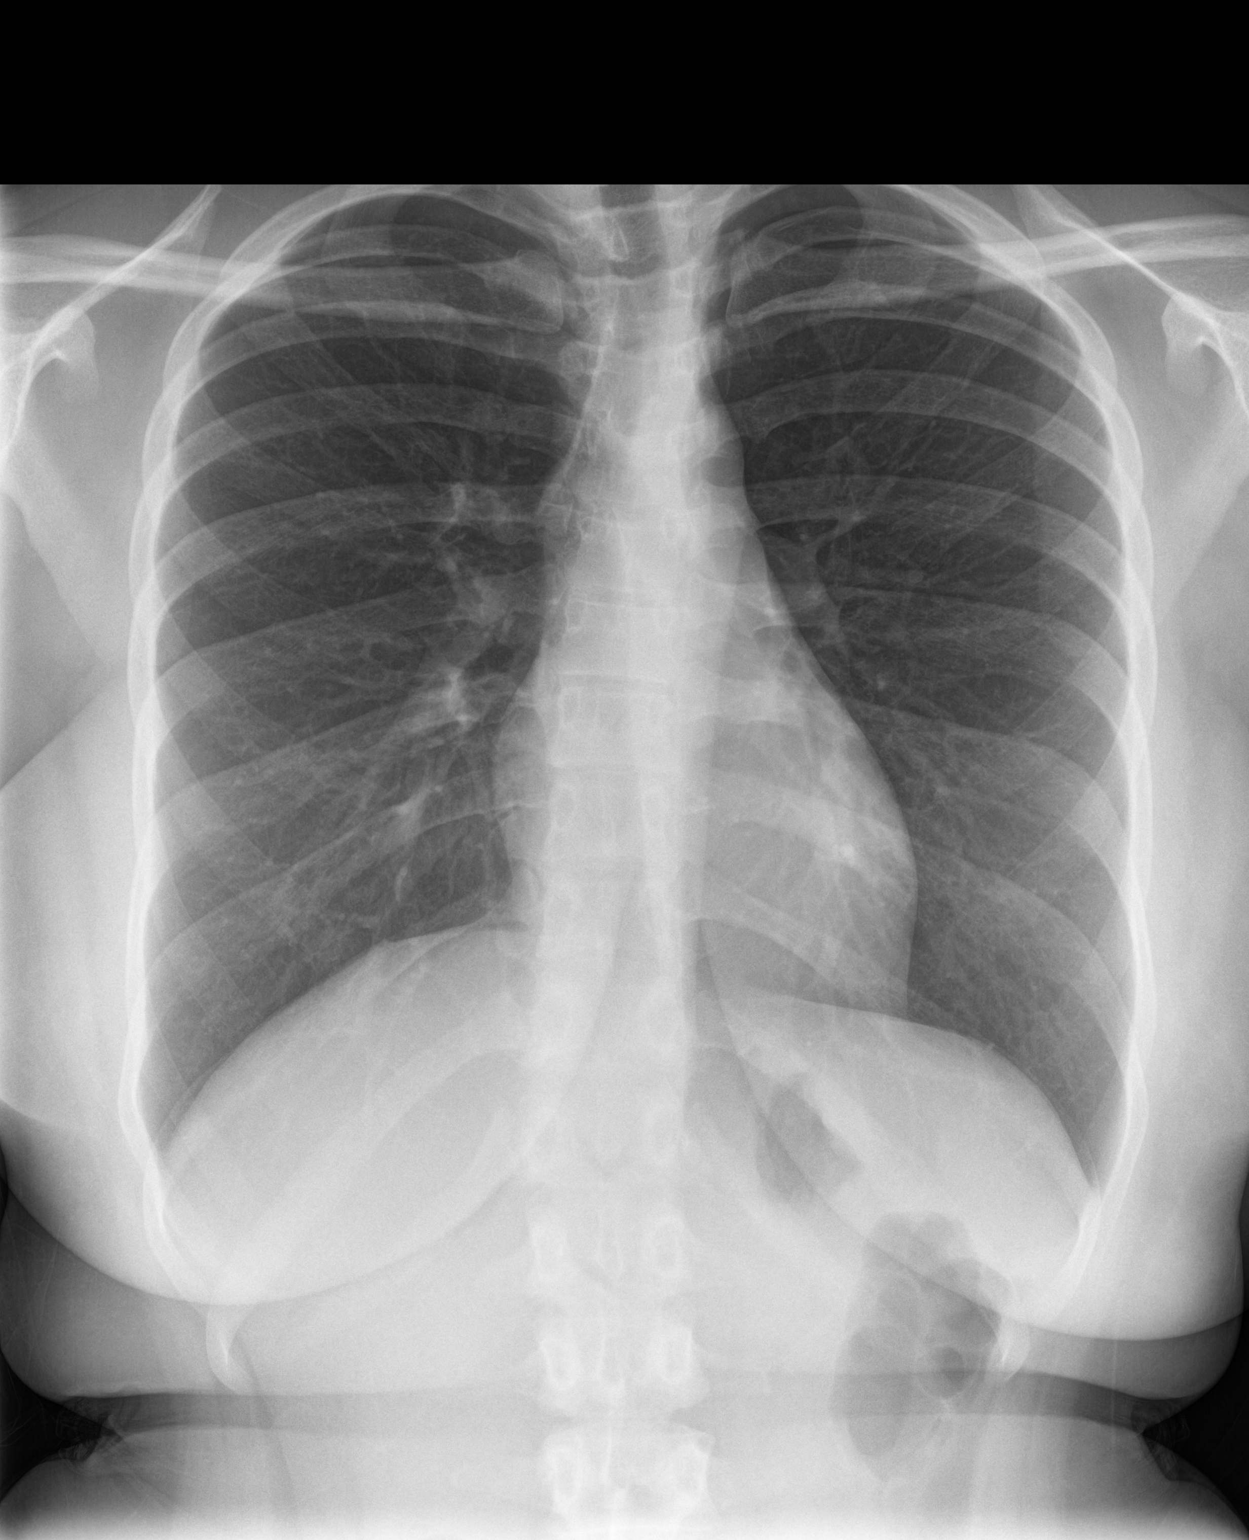

[chest lat]
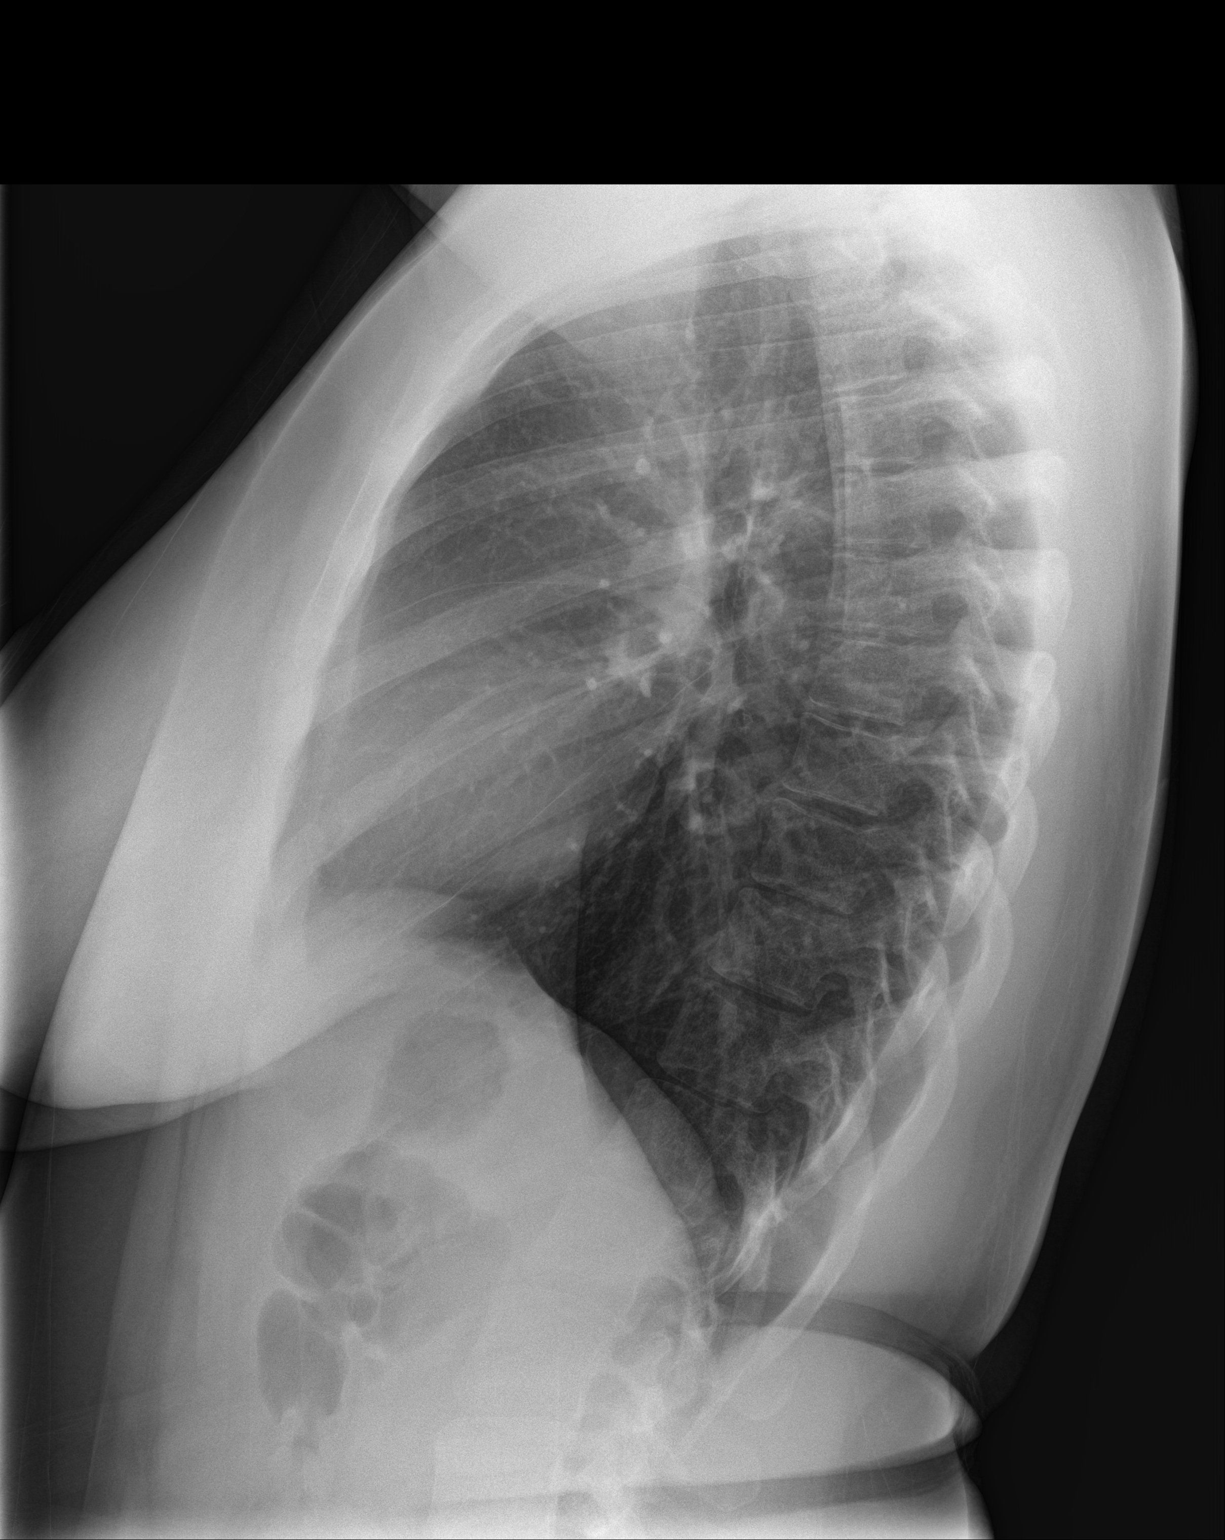

[2 of 2 positions shown; findings below may reference images not displayed]

FINDINGS: The heart size and mediastinal contours are normal. Both lungs are
clear. No pneumothorax or pleural effusion. Visualized tracheal air
column is within normal limits. Mild to moderate thoracolumbar
scoliosis. No acute osseous abnormality identified. Negative visible
bowel gas pattern.
IMPRESSION: Normal aside from mild scoliosis. No acute cardiopulmonary
abnormality.

## 2018-08-05 ENCOUNTER — Encounter: Payer: Self-pay | Admitting: Internal Medicine

## 2018-08-05 ENCOUNTER — Ambulatory Visit (INDEPENDENT_AMBULATORY_CARE_PROVIDER_SITE_OTHER): Payer: Self-pay | Admitting: Internal Medicine

## 2018-08-05 ENCOUNTER — Other Ambulatory Visit: Payer: Self-pay

## 2018-08-05 VITALS — BP 108/74 | HR 94 | Temp 98.4°F | Ht 64.0 in | Wt 212.0 lb

## 2018-08-05 DIAGNOSIS — J029 Acute pharyngitis, unspecified: Secondary | ICD-10-CM

## 2018-08-05 MED ORDER — AMOXICILLIN 875 MG PO TABS
875.0000 mg | ORAL_TABLET | Freq: Two times a day (BID) | ORAL | 0 refills | Status: AC
Start: 2018-08-05 — End: 2018-08-15

## 2018-08-05 NOTE — Progress Notes (Signed)
Date:  08/05/2018   Name:  Monica Moran   DOB:  Jul 03, 1990   MRN:  [id number]   Chief Complaint: Sore Throat (Started Monday- Sore and irritated. Has bad allergies. )  Sore Throat   This is a new problem. The current episode started in the past 7 days. The problem has been unchanged. There has been no fever. The pain is mild. Associated symptoms include congestion. Pertinent negatives include no diarrhea, headaches, shortness of breath, trouble swallowing or vomiting. She has had exposure to strep. 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    Review of Systems  Constitutional: Negative for chills, fatigue and fever.  HENT: Positive for congestion, postnasal drip, sinus pressure and sore throat. Negative for trouble swallowing.   Respiratory: Negative for chest tightness, shortness of breath and wheezing.   Cardiovascular: Negative for chest pain.  Gastrointestinal: Negative for diarrhea, nausea and vomiting.  Neurological: Negative for dizziness and headaches.    Patient Active Problem List   Diagnosis Date Noted  . 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 Mild recurrent major depression ([place]) 05/22/2017  . Uses contraceptive implants for birth control 06/13/2016  . 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 NO PAST SURGERIES      Social History   Tobacco Use  . Smoking status: Never Smoker  . Smokeless tobacco: Never Used  Substance Use Topics  . Alcohol use: No    Frequency: Never  . Drug use: No     Medication list has been reviewed and updated.  Current Meds  Medication Sig  . etonogestrel (NEXPLANON) 68 MG IMPL implant 1 each by Subdermal route once.    PHQ 2/9 Scores 08/05/2018 06/02/2017 04/27/2017  PHQ - 2 Score 0 0 0  PHQ- 9 Score - 0 -    BP Readings from Last 3 Encounters:  08/05/18 108/74  10/29/17 (!) 123/94  08/05/17 (!) 116/93     Physical Exam Constitutional:      Appearance: She is well-developed.  HENT:     Right Ear: Ear canal and external ear normal. Tympanic membrane is not erythematous or retracted.     Left Ear: Ear canal and external ear normal. Tympanic membrane is not erythematous or retracted.     Nose:     Right Sinus: Maxillary sinus tenderness and frontal sinus tenderness present.     Left Sinus: Maxillary sinus tenderness and frontal sinus tenderness present.     Mouth/Throat:     Mouth: Mucous membranes are moist. No oral lesions.     Pharynx: Uvula midline. No oropharyngeal exudate or posterior oropharyngeal erythema.     Tonsils: No tonsillar exudate or tonsillar abscesses. 2+ on the right. 2+ on the left.  Cardiovascular:     Rate and Rhythm: Normal rate and regular rhythm.     Heart sounds: Normal heart sounds.  Pulmonary:     Breath sounds: Normal breath sounds. No wheezing or rales.  Lymphadenopathy:     Cervical: No cervical adenopathy.  Neurological:     Mental Status: She is alert and oriented to person, place, and time.     Wt Readings from Last 3 Encounters:  08/05/18 212 lb (96.2 kg)  10/29/17 180 lb (81.6 kg)  08/05/17 180 lb (81.6 kg)    BP 108/74   Pulse 94   Temp 98.4 F (36.9 C) (Oral)   Ht  5\' 4"  (1.626 m)   Wt 212 lb (96.2 kg)   SpO2 98%   BMI 36.39 kg/m   Assessment and Plan: 1. Pharyngitis, unspecified etiology Supportive care; begin antibiotics if sx worsen Note given to return to work - amoxicillin (AMOXIL) 875 MG tablet; Take 1 tablet (875 mg total) by mouth 2 (two) times daily for 10 days.  Dispense: 20 tablet; Refill: 0   Partially dictated using Editor, commissioning. Any errors are unintentional.  Halina Maidens, MD [facility]  08/05/2018

## 2019-05-09 ENCOUNTER — Other Ambulatory Visit: Payer: Medicaid Other

## 2019-05-09 ENCOUNTER — Ambulatory Visit: Payer: Medicaid Other | Attending: Internal Medicine

## 2019-05-09 DIAGNOSIS — Z20822 Contact with and (suspected) exposure to covid-19: Secondary | ICD-10-CM

## 2019-05-10 LAB — NOVEL CORONAVIRUS, NAA: SARS-CoV-2, NAA: DETECTED — AB

## 2019-05-11 ENCOUNTER — Telehealth: Payer: Self-pay | Admitting: Nurse Practitioner

## 2019-05-11 NOTE — Telephone Encounter (Signed)
Called to Discuss with patient about Covid symptoms and the use of bamlanivimab, a monoclonal antibody infusion for those with mild to moderate Covid symptoms and at a high risk of hospitalization.     Pt is qualified for this infusion at the [facility] infusion center due to co-morbid conditions and/or a member of an at-risk group.     Patient Active Problem List   Diagnosis Date Noted  . 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 Mild recurrent major depression ([place]) 05/22/2017  . Uses contraceptive implants for birth control 06/13/2016  . 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 States she will think about it and call us back. Symptoms tier reviewed as well as criteria for ending isolation. Preventative practices reviewed. Patient verbalized understanding.    Patient advised to call back if she decides that she does want to get infusion. Callback number to the infusion center given. Patient advised to go to Urgent care or ED with severe symptoms.   Symptoms started 05/05/19

## 2019-08-17 ENCOUNTER — Other Ambulatory Visit: Payer: Self-pay

## 2019-08-17 ENCOUNTER — Encounter: Payer: Self-pay | Admitting: Obstetrics and Gynecology

## 2019-08-17 ENCOUNTER — Ambulatory Visit (INDEPENDENT_AMBULATORY_CARE_PROVIDER_SITE_OTHER): Payer: Medicaid Other | Admitting: Obstetrics and Gynecology

## 2019-08-17 VITALS — BP 120/80 | Ht 66.0 in | Wt 195.0 lb

## 2019-08-17 DIAGNOSIS — Z3202 Encounter for pregnancy test, result negative: Secondary | ICD-10-CM | POA: Diagnosis not present

## 2019-08-17 DIAGNOSIS — R399 Unspecified symptoms and signs involving the genitourinary system: Secondary | ICD-10-CM | POA: Diagnosis not present

## 2019-08-17 DIAGNOSIS — Z30011 Encounter for initial prescription of contraceptive pills: Secondary | ICD-10-CM

## 2019-08-17 DIAGNOSIS — N926 Irregular menstruation, unspecified: Secondary | ICD-10-CM

## 2019-08-17 DIAGNOSIS — Z3046 Encounter for surveillance of implantable subdermal contraceptive: Secondary | ICD-10-CM | POA: Diagnosis not present

## 2019-08-17 LAB — POCT URINALYSIS DIPSTICK
Bilirubin, UA: NEGATIVE
Glucose, UA: NEGATIVE
Ketones, UA: NEGATIVE
Nitrite, UA: NEGATIVE
Protein, UA: POSITIVE — AB
Spec Grav, UA: 1.015 (ref 1.010–1.025)
Urobilinogen, UA: NEGATIVE E.U./dL — AB
pH, UA: 7 (ref 5.0–8.0)

## 2019-08-17 LAB — POCT HEMOGLOBIN: Hemoglobin: 14.5 g/dL (ref 11–14.6)

## 2019-08-17 LAB — POCT URINE PREGNANCY: Preg Test, Ur: NEGATIVE

## 2019-08-17 MED ORDER — MICROGESTIN 24 FE 1-20 MG-MCG PO TABS: 1.0000 | ORAL_TABLET | Freq: Every day | ORAL | 0 refills | Status: DC

## 2019-08-17 MED ORDER — NITROFURANTOIN MONOHYD MACRO 100 MG PO CAPS: 100.0000 mg | ORAL_CAPSULE | Freq: Two times a day (BID) | ORAL | 0 refills | Status: AC

## 2019-08-17 NOTE — Patient Instructions (Addendum)
I value your feedback and entrusting us with your care. If you get a [facility] patient survey, I would appreciate you taking the time to let us know about your experience today. Thank you!  As of March 17, 2019, your lab results will be released to your MyChart immediately, before I even have a chance to see them. Please give me time to review them and contact you if there are any abnormalities. Thank you for your patience.   Remove the dressing in 24 hours,  keep the incision area dry for 24 hours and remove the Steristrip in 2-3  days.  Notify us if any signs of tenderness, redness, pain, or fevers develop.  

## 2019-08-17 NOTE — Progress Notes (Signed)
Monica Hess, MD   Chief Complaint  Patient presents with  . Menstrual Problem    extreme pelvic pain during cycle only, heavy flow changing pad every 45 mins  . Urinary Tract Infection    frequency and burning urinating since Sunday    HPI:      [name] is a 29 y.o. G2P0002 whose LMP was No LMP recorded. Patient has had an implant., presents today for NP >3 yrs eval of irregular bleeding and cramping with nexplanon. Nexplanon replaced 2/18 (EXPIRED), and has had irregular random, heavy bleeding since insertion. This was 2nd nexplanon; no bleeding with first one but also breastfed for 3 yrs. Flow is heavy, changing pads about every 45-60 min with clots. Has dysmen with bleeding, usually improved with NSAIDs. Bleeding started 4 days ago and cramping much worse than usual. Not relieved with NSAIDs and waking her at night. Would like to have nexplanon removed and change to different [facility] for cycle control, too. No hx of HTN, DVTs, migraines with aura. Has never done any other BC.  Also with urianry frequency/urgency and dysuria for a few days. No LBP, fevers, vag sx. Hx of frequent UTIs. No recent abx use.   She is sex active, no new partners. No pain/bleeidng with sex.  Past due for annual.   History reviewed. No pertinent past medical history.  Past Surgical History:  Procedure Laterality Date  . NO PAST SURGERIES      Family History  Problem Relation Age of Onset  . Hypertension Paternal Grandmother   . Diabetes Paternal Grandmother     Social History   Socioeconomic History  . Marital status: Single    Spouse name: Not on file  . Number of children: 2  . Years of education: Not on file  . Highest education level: Not on file  Occupational History  . Not on file  Tobacco Use  . Smoking status: Never Smoker  . Smokeless tobacco: Never Used  Substance and Sexual Activity  . Alcohol use: No  . Drug use: No  . Sexual activity: Yes    Birth  control/protection: Implant    Comment: nexplanon  Other Topics Concern  . Not on file  Social History Narrative  . Not on file   Social Determinants of Health   Financial Resource Strain:   . Difficulty of Paying Living Expenses:   Food Insecurity:   . Worried About Charity fundraiser in the Last Year:   . Arboriculturist in the Last Year:   Transportation Needs:   . Film/video editor (Medical):   Marland Kitchen Lack of Transportation (Non-Medical):   Physical Activity:   . Days of Exercise per Week:   . Minutes of Exercise per Session:   Stress:   . Feeling of Stress :   Social Connections:   . Frequency of Communication with Friends and Family:   . Frequency of Social Gatherings with Friends and Family:   . Attends Religious Services:   . Active Member of Clubs or Organizations:   . Attends Archivist Meetings:   Marland Kitchen Marital Status:   Intimate Partner Violence:   . Fear of Current or Ex-Partner:   . Emotionally Abused:   Marland Kitchen Physically Abused:   . Sexually Abused:     Outpatient Medications Prior to Visit  Medication Sig Dispense Refill  . etonogestrel (NEXPLANON) 68 MG IMPL implant 1 each by Subdermal route once.  No facility-administered medications prior to visit.      ROS:  Review of Systems  Constitutional: Negative for fever.  Gastrointestinal: Negative for blood in stool, constipation, diarrhea, nausea and vomiting.  Genitourinary: Positive for dysuria, frequency and menstrual problem. Negative for dyspareunia, flank pain, hematuria, urgency, vaginal bleeding, vaginal discharge and vaginal pain.  Musculoskeletal: Negative for back pain.  Skin: Negative for rash.    OBJECTIVE:   Vitals:  BP 120/80   Ht 5\' 6"  (1.676 m)   Wt 195 lb (88.5 kg)   BMI 31.47 kg/m   Physical Exam Vitals reviewed.  Constitutional:      Appearance: She is well-developed.  Pulmonary:     Effort: Pulmonary effort is normal.  Musculoskeletal:        General: Normal  range of motion.     Cervical back: Normal range of motion.  Skin:    General: Skin is warm and dry.  Neurological:     General: No focal deficit present.     Mental Status: She is alert and oriented to person, place, and time.     Cranial Nerves: No cranial nerve deficit.  Psychiatric:        Mood and Affect: Mood normal.        Behavior: Behavior normal.        Thought Content: Thought content normal.        Judgment: Judgment normal.     Results: Results for orders placed or performed in visit on 08/17/19 (from the past 24 hour(s))  POCT Urinalysis Dipstick     Status: Abnormal   Collection Time: 08/17/19  2:31 PM  Result Value Ref Range   Color, UA     Clarity, UA     Glucose, UA Negative Negative   Bilirubin, UA neg    Ketones, UA neg    Spec Grav, UA 1.015 1.010 - 1.025   Blood, UA 3+    pH, UA 7.0 5.0 - 8.0   Protein, UA Positive (A) Negative   Urobilinogen, UA negative (A) 0.2 or 1.0 E.U./dL   Nitrite, UA neg    Leukocytes, UA     Appearance     Odor    POCT urine pregnancy     Status: Normal   Collection Time: 08/17/19  2:33 PM  Result Value Ref Range   Preg Test, Ur Negative Negative  POCT hemoglobin     Status: Normal   Collection Time: 08/17/19  2:58 PM  Result Value Ref Range   Hemoglobin 14.5 11 - 14.6 g/dL   Nexplanon removal Procedure note - The Nexplanon was noted in the patient's arm and the end was identified. The skin was cleansed with a Betadine solution. A small injection of subcutaneous lidocaine with epinephrine was given over the end of the implant. An incision was made at the end of the implant. The rod was noted in the incision and grasped with a hemostat. It was noted to be intact.  Steri-Strip was placed approximating the incision. Hemostasis was noted.   Assessment/Plan: Irregular bleeding - Plan: POCT urine pregnancy, POCT hemoglobin; Neg UPT. Nexplanon expired. Pt wants different BC. OCP start today. See if bleeding/cramping improve. If  so, f/u at annual in 1-2 months. F/u sooner for further eval if sx persist.   UTI symptoms - Plan: POCT Urinalysis Dipstick, nitrofurantoin, macrocrystal-monohydrate, (MACROBID) 100 MG capsule, Urine Culture; Pos sx and questionable UA. Rx macrobid. Check C&S. F/u prn.   Nexplanon removal--removed due to expiration. Change  to OCPs.  Encounter for initial prescription of contraceptive pills - Plan: Norethindrone Acetate-Ethinyl Estrad-FE (MICROGESTIN 24 FE) 1-20 MG-MCG(24) tablet; BC options discussed. Pt would like OCPs. Rx eRxd. Start today. Condoms. F/u at annual/sooner prn   Meds ordered this encounter  Medications  . nitrofurantoin, macrocrystal-monohydrate, (MACROBID) 100 MG capsule    Sig: Take 1 capsule (100 mg total) by mouth 2 (two) times daily for 5 days.    Dispense:  10 capsule    Refill:  0    Order Specific Question:   Supervising Provider    Answer:   Gae Dry [id number]  . Norethindrone Acetate-Ethinyl Estrad-FE (MICROGESTIN 24 FE) 1-20 MG-MCG(24) tablet    Sig: Take 1 tablet by mouth daily.    Dispense:  84 tablet    Refill:  0    Order Specific Question:   Supervising Provider    Answer:   Gae Dry [id number]      Return in about 2 months (around 0000000) for annual.  Ezri Fanguy B. Ahsley Attwood, PA-C 08/17/2019 3:58 PM

## 2019-08-20 LAB — URINE CULTURE

## 2019-08-25 ENCOUNTER — Telehealth: Payer: Self-pay

## 2019-08-25 MED ORDER — SULFAMETHOXAZOLE-TRIMETHOPRIM 800-160 MG PO TABS: 1.0000 | ORAL_TABLET | Freq: Two times a day (BID) | ORAL | 0 refills | Status: AC

## 2019-08-25 NOTE — Telephone Encounter (Signed)
Pt aware.

## 2019-08-25 NOTE — Telephone Encounter (Signed)
Bactrim eRxd. BID for 7 days. F/u prn.

## 2019-08-25 NOTE — Telephone Encounter (Signed)
Patient seen 08/17/19 treated w/Nitrofurantoin for UTI. She reports the meds didn't work and is requesting a different rx. [id number]

## 2019-08-25 NOTE — Telephone Encounter (Signed)
Patient reports she is still experiencing urinary frequency w/dysuria and pelvic pain. She did complete all the medication on Tuesday.

## 2019-08-27 ENCOUNTER — Other Ambulatory Visit: Payer: Self-pay

## 2019-08-27 ENCOUNTER — Ambulatory Visit (INDEPENDENT_AMBULATORY_CARE_PROVIDER_SITE_OTHER): Payer: Self-pay

## 2019-08-27 ENCOUNTER — Ambulatory Visit
Admission: EM | Admit: 2019-08-27 | Discharge: 2019-08-27 | Disposition: A | Discharge status: Home / Self Care | Payer: Medicaid Other | Attending: Emergency Medicine | Admitting: Emergency Medicine

## 2019-08-27 DIAGNOSIS — D270 Benign neoplasm of right ovary: Secondary | ICD-10-CM | POA: Insufficient documentation

## 2019-08-27 DIAGNOSIS — R300 Dysuria: Secondary | ICD-10-CM

## 2019-08-27 DIAGNOSIS — R109 Unspecified abdominal pain: Secondary | ICD-10-CM | POA: Insufficient documentation

## 2019-08-27 LAB — URINALYSIS, COMPLETE (UACMP) WITH MICROSCOPIC
Bilirubin Urine: NEGATIVE
Glucose, UA: NEGATIVE mg/dL
Hgb urine dipstick: NEGATIVE
Ketones, ur: NEGATIVE mg/dL
Nitrite: NEGATIVE
Specific Gravity, Urine: 1.025 (ref 1.005–1.030)
pH: 8.5 — ABNORMAL HIGH (ref 5.0–8.0)

## 2019-08-27 LAB — PREGNANCY, URINE: Preg Test, Ur: NEGATIVE

## 2019-08-27 NOTE — Discharge Instructions (Addendum)
Continue taking the Bactrim.  Rest. Drink plenty of fluids.   It is very important that you have close follow-up with your OB/GYN.  Follow-up on Monday.  For any increase back or side pain, abdominal pain or worsening concerns proceed directly to emergency room.

## 2019-08-27 NOTE — ED Provider Notes (Signed)
[facility] ____________________________________________  Time seen: Approximately 1:40 PM  I have reviewed the triage vital signs and the nursing notes.   HISTORY  Chief Complaint Dysuria  HPI Monica Moran is a 29 y.o. female presenting for evaluation of urinary frequency, urinary urgency, low back pain and left back pain that has been ongoing for the last 1.5 weeks.  Has had occasional hematuria, no vaginal bleeding.  Patient reports on May 9 she began having dysuria complaints.  Was seen by her OB/GYN office and was started on [place].  States her symptoms continued and 2 days ago she called back to her GYN and was started on oral Bactrim.  States currently she is having left flank pain and left low back pain.  States has still been intermittently having urinary frequency, urgency and burning with urination but not constantly.  Denies any vaginal discharge, vaginal discomfort, vaginal odor, concern for STDs.  Denies current abdominal pain.  Has had occasional nausea in which she contributed to fairly recently switching her birth control.  Denies any vomiting, diarrhea, constipation, abnormal colored stools, fevers, chest pain or shortness of breath.  Denies injury.  Denies pain with movement.  States left flank pain comes and goes currently mild to moderate.  Denies recent sickness otherwise.  Currently on Bactrim and has taken almost 2 days worth.  Reports otherwise doing well.  Has continued remain active.  Denies history of kidney stones, pyelonephritis, ovarian cysts or endometriosis.  [name], MD: PCP OBGYN: Jola Babinski    History reviewed. No pertinent past medical history.  Patient Active Problem List   Diagnosis Date Noted  . 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 Uses contraceptive implants for birth control 06/13/2016  . 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 NO PAST SURGERIES       No current facility-administered medications for this encounter.  Current Outpatient Medications:  .  Norethindrone Acetate-Ethinyl Estrad-FE (MICROGESTIN 24 FE) 1-20 MG-MCG(24) tablet, Take 1 tablet by mouth daily., Disp: 84 tablet, Rfl: 0 .  sulfamethoxazole-trimethoprim (BACTRIM DS) 800-160 MG tablet, Take 1 tablet by mouth 2 (two) times daily for 7 days., Disp: 14 tablet, Rfl: 0 .  etonogestrel (NEXPLANON) 68 MG IMPL implant, 1 each by Subdermal route once., Disp: , Rfl:   Allergies Patient has no known allergies.  Family History  Problem Relation Age of Onset  . Hypertension Paternal Grandmother   . Diabetes Paternal Grandmother     Social History Social History   Tobacco Use  . Smoking status: Never Smoker  . Smokeless tobacco: Never Used  Substance Use Topics  . Alcohol use: No  . Drug use: No    Review of Systems Constitutional: No fever Cardiovascular: Denies chest pain. Respiratory: Denies shortness of breath. Gastrointestinal: No abdominal pain.  No vomiting.  No diarrhea.  No constipation. Genitourinary: Positive for dysuria. Musculoskeletal: Positive for back pain. Skin: Negative for rash. Neurological: Negative for headaches, focal weakness or numbness.    ____________________________________________   PHYSICAL EXAM:  VITAL SIGNS: ED Triage Vitals  Enc Vitals Group     BP 08/27/19 1333 (!) 130/95     Pulse Rate 08/27/19 1333 89     Resp 08/27/19 1333 18     Temp 08/27/19 1333 98.6 F (37 C)     Temp Source 08/27/19 1333 Oral     SpO2  08/27/19 1333 100 %     Weight 08/27/19 1330 200 lb (90.7 kg)     Height 08/27/19 1330 5\' 6"  (1.676 m)     Head Circumference --      Peak Flow --      Pain Score 08/27/19 1329 9     Pain Loc --      Pain Edu? --      Excl. in [place]? --     Constitutional: Alert and oriented. Well appearing and in no acute distress. Eyes:  Conjunctivae are normal.  ENT      Head: Normocephalic and atraumatic. Cardiovascular: Normal rate, regular rhythm. Grossly normal heart sounds.  Good peripheral circulation. Respiratory: Normal respiratory effort without tachypnea nor retractions. Breath sounds are clear and equal bilaterally. No wheezes, rales, rhonchi. Gastrointestinal: No distention. Normal Bowel sounds.  Minimal midline suprapubic tenderness to palpation.  Abdomen otherwise soft and nontender.  No CVA tenderness. Musculoskeletal: No midline cervical, thoracic or lumbar tenderness to palpation.  Neurologic:  Normal speech and language. Speech is normal. No gait instability.  Skin:  Skin is warm, dry and intact. No rash noted. Psychiatric: Mood and affect are normal. Speech and behavior are normal. Patient exhibits appropriate insight and judgment   ___________________________________________   LABS (all labs ordered are listed, but only abnormal results are displayed)  Labs Reviewed  URINALYSIS, COMPLETE (UACMP) WITH MICROSCOPIC - Abnormal; Notable for the following components:      Result Value   APPearance HAZY (*)    pH 8.5 (*)    Protein, ur TRACE (*)    Leukocytes,Ua TRACE (*)    Bacteria, UA FEW (*)    All other components within normal limits  URINE CULTURE  PREGNANCY, URINE    08/17/19 Comment: Staphylococcus saprophyticus  Greater than 100,000 colony forming units per mL  The CLSI does not advise routine susceptibility testing of urinary  tract isolates of Staphylococcus saprophyticus, because acute,  uncomplicated urinary tract infections caused by this organism respond  to concentrations achieved in urine of antimicrobial agents commonly  used to treat these infections, such as nitrofurantoin, a  fluoroquinolone, or trimethoprim with or without sulfamethoxazole.  ____________________________________________  RADIOLOGY  CT Renal Stone Study  Result Date: 08/27/2019 CLINICAL DATA:  Dysuria with  left flank pain for 2 weeks. On antibiotics. EXAM: CT ABDOMEN AND PELVIS WITHOUT CONTRAST TECHNIQUE: Multidetector CT imaging of the abdomen and pelvis was performed following the standard protocol without IV contrast. COMPARISON:  Obstetric ultrasound 02/01/2011 FINDINGS: Lower chest: Clear lung bases. No significant pleural or pericardial effusion. Hepatobiliary: No focal hepatic abnormalities identified on noncontrast imaging. No evidence of gallstones, gallbladder wall thickening or biliary dilatation. Pancreas: Unremarkable. No pancreatic ductal dilatation or surrounding inflammatory changes. Spleen: Normal in size without focal abnormality. Adrenals/Urinary Tract: Both adrenal glands appear normal. No evidence of urinary tract calculus or hydronephrosis. The kidneys, ureters and bladder appear unremarkable as imaged in the noncontrast state. Stomach/Bowel: No evidence of bowel wall thickening, distention or surrounding inflammatory change. The appendix appears normal. Vascular/Lymphatic: No significant vascular findings on noncontrast imaging. No enlarged abdominopelvic lymph nodes are seen. 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 Remote obstetric ultrasound suggested a small right ovarian dermoid measuring approximately 2 cm. The uterus and left ovary appear normal. Other: No ascites or free air.  Intact intraabdominal wall. Musculoskeletal: No acute or significant osseous findings. Small bone island posterolaterally in the  left iliac bone. IMPRESSION: 1. No acute findings or explanation for the patient's symptoms. No evidence of urinary tract calculus or hydronephrosis. 2. 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 Remote obstetric ultrasound suggested a small right ovarian dermoid measuring approximately 2 cm. Gynecology follow-up recommended. Annual pelvic ultrasound follow-up recommended  if not surgically removed. This recommendation follows ACR consensus guidelines: White Paper of the ACR Incidental Findings Committee II on Adnexal Findings. J Am Coll Radiol [phone]. Electronically Signed   By: Richardean Sale M.D.   On: 08/27/2019 14:52   ____________________________________________   PROCEDURES Procedures    INITIAL IMPRESSION / ASSESSMENT AND PLAN / ED COURSE  Pertinent labs & imaging results that were available during my care of the patient were reviewed by me and considered in my medical decision making (see chart for details).  Of note as above on 5/12 urine culture sensitive to Bactrim.  Well-appearing patient.  Resting calmly in room.  No acute distress.  Discussed multiple differentials with patient including continued urinary tract infection, pyelonephritis, kidney stone, pelvic etiology.  Reviewed notes and recent visits with currently on Bactrim and her most recent urine culture showed sensitivity.  Urinalysis today few bacteria we will culture urine.  CT renal as above per radiologist, no acute findings, no evidence of urinary tract calculus or hydronephrosis, 5 cm fatty mass posterior right adnexa likely ovarian dermoid previously ultrasound suggesting small right dermoid.  Discussed these findings in detail with patient as well as copy of CT report given to patient and directed to follow-up very closely with OB/GYN this week, patient states she will follow up on Monday.  Recommend to continue Bactrim as most recent urine culture noted sensitivity and patient has been on antibiotic for 2 days.  Offered IM Rocephin due to concern of infection, patient declined and states she wants to continue with oral antibiotic only.   Discussed any increasing pain and flank as well as pelvic proceed erectly to emergency room.  Discussed follow up with Primary care physician this week. Discussed follow up and return parameters including no resolution or any worsening  concerns. Patient verbalized understanding and agreed to plan.   ____________________________________________   FINAL CLINICAL IMPRESSION(S) / ED DIAGNOSES  Final diagnoses:  Dysuria  Left flank pain  Dermoid cyst of right ovary     ED Discharge Orders    None       Note: This dictation was prepared with Dragon dictation along with smaller phrase technology. Any transcriptional errors that result from this process are unintentional.         Marylene Land, NP 08/27/19 1559

## 2019-08-27 NOTE — ED Triage Notes (Addendum)
Patient complains of dysuria since 08/14/2019. States that she has completed a course of Macrobid and is currently on Bactrim. Patient states that she called back to her GYN and states that she cannot seem to get rid of this one. States that she is having low back pain and flank pain that has been constant.

## 2019-08-29 LAB — URINE CULTURE

## 2019-10-14 NOTE — Progress Notes (Deleted)
PCP:  [name], MD   No chief complaint on file.    HPI:      [name] is a 29 y.o. G2P0002 whose LMP was No LMP recorded., presents today for her annual examination.  Her menses are {norm/abn:715}, lasting {number:22536} days.  Dysmenorrhea {dysmen:716}. She {does:18564} have intermenstrual bleeding.  Sex activity: single partner, contraception - OCP (estrogen/progesterone). Changed from expired nexplanon 5/21. Last Pap: 06/13/16  Results were: no abnormalities Hx of STDs: {STD hx:14358}  UTI 5/21?  There is no FH of breast cancer. There is no FH of ovarian cancer. The patient {does:18564} do self-breast exams.  Tobacco use: {tob:20664} Alcohol use: {Alcohol:11675} No drug use.  Exercise: {exercise:31265}  She {does:18564} get adequate calcium and Vitamin D in her diet.    The pregnancy intention screening data noted above was reviewed. Potential methods of contraception were discussed. The patient elected to proceed with {Upstream End Methods:24109}.    No past medical history on file.  Past Surgical History:  Procedure Laterality Date  . NO PAST SURGERIES      Family History  Problem Relation Age of Onset  . Hypertension Paternal Grandmother   . Diabetes Paternal Grandmother     Social History   Socioeconomic History  . Marital status: Single    Spouse name: Not on file  . Number of children: 2  . Years of education: Not on file  . Highest education level: Not on file  Occupational History  . Not on file  Tobacco Use  . Smoking status: Never Smoker  . Smokeless tobacco: Never Used  Vaping Use  . Vaping Use: Never used  Substance and Sexual Activity  . Alcohol use: No  . Drug use: No  . Sexual activity: Yes    Birth control/protection: Implant    Comment: nexplanon  Other Topics Concern  . Not on file  Social History Narrative  . Not on file   Social Determinants of Health   Financial Resource Strain:   . Difficulty of  Paying Living Expenses:   Food Insecurity:   . Worried About Charity fundraiser in the Last Year:   . Arboriculturist in the Last Year:   Transportation Needs:   . Film/video editor (Medical):   Marland Kitchen Lack of Transportation (Non-Medical):   Physical Activity:   . Days of Exercise per Week:   . Minutes of Exercise per Session:   Stress:   . Feeling of Stress :   Social Connections:   . Frequency of Communication with Friends and Family:   . Frequency of Social Gatherings with Friends and Family:   . Attends Religious Services:   . Active Member of Clubs or Organizations:   . Attends Archivist Meetings:   Marland Kitchen Marital Status:   Intimate Partner Violence:   . Fear of Current or Ex-Partner:   . Emotionally Abused:   Marland Kitchen Physically Abused:   . Sexually Abused:      Current Outpatient Medications:  .  etonogestrel (NEXPLANON) 68 MG IMPL implant, 1 each by Subdermal route once., Disp: , Rfl:  .  Norethindrone Acetate-Ethinyl Estrad-FE (MICROGESTIN 24 FE) 1-20 MG-MCG(24) tablet, Take 1 tablet by mouth daily., Disp: 84 tablet, Rfl: 0     ROS:  Review of Systems BREAST: No symptoms   Objective: There were no vitals taken for this visit.   OBGyn Exam  Results: No results found for this or any previous visit (from  the past 24 hour(s)).  Assessment/Plan: No diagnosis found.  No orders of the defined types were placed in this encounter.            GYN counsel {counseling:16159}     F/U  No follow-ups on file.  Quaron Delacruz B. Imaan Padgett, PA-C 10/14/2019 9:56 AM

## 2019-10-17 ENCOUNTER — Ambulatory Visit: Payer: Medicaid Other | Admitting: Obstetrics and Gynecology

## 2019-11-02 ENCOUNTER — Encounter: Payer: Self-pay | Admitting: Advanced Practice Midwife

## 2019-11-02 ENCOUNTER — Other Ambulatory Visit (HOSPITAL_COMMUNITY)
Admission: RE | Admit: 2019-11-02 | Discharge: 2019-11-02 | Disposition: A | Discharge status: Home / Self Care | Payer: Medicaid Other | Source: Ambulatory Visit | Attending: Obstetrics and Gynecology | Admitting: Obstetrics and Gynecology

## 2019-11-02 ENCOUNTER — Other Ambulatory Visit: Payer: Self-pay

## 2019-11-02 ENCOUNTER — Ambulatory Visit (INDEPENDENT_AMBULATORY_CARE_PROVIDER_SITE_OTHER): Payer: Medicaid Other | Admitting: Advanced Practice Midwife

## 2019-11-02 VITALS — BP 139/82 | HR 83 | Ht 66.0 in | Wt 187.0 lb

## 2019-11-02 DIAGNOSIS — Z3202 Encounter for pregnancy test, result negative: Secondary | ICD-10-CM

## 2019-11-02 DIAGNOSIS — Z01419 Encounter for gynecological examination (general) (routine) without abnormal findings: Secondary | ICD-10-CM | POA: Insufficient documentation

## 2019-11-02 DIAGNOSIS — Z30016 Encounter for initial prescription of transdermal patch hormonal contraceptive device: Secondary | ICD-10-CM

## 2019-11-02 DIAGNOSIS — Z124 Encounter for screening for malignant neoplasm of cervix: Secondary | ICD-10-CM | POA: Insufficient documentation

## 2019-11-02 DIAGNOSIS — N912 Amenorrhea, unspecified: Secondary | ICD-10-CM

## 2019-11-02 LAB — POCT URINE PREGNANCY: Preg Test, Ur: NEGATIVE

## 2019-11-02 MED ORDER — NORELGESTROMIN-ETH ESTRADIOL 150-35 MCG/24HR TD PTWK: 1.0000 | MEDICATED_PATCH | TRANSDERMAL | 12 refills | Status: DC

## 2019-11-02 NOTE — Progress Notes (Signed)
Gynecology Annual Exam   PCP: [name], MD  Chief Complaint:  Chief Complaint  Patient presents with   Gynecologic Exam    irregular cycles, discuss new birthcontrol    History of Present Illness: Patient is a 29 y.o. G2P0002 presents for annual exam. The patient has complaint today of irregular cycle since starting OCP several months ago. She is interested in switching to the patch since she does not always remember to take the pill. Her last period was around the beginning of June. UPT is offered today. We discussed other birth control methods including IUD which she may be interested in. She will let us know.    LMP: No LMP recorded. Average Interval: irregular, 1 period since Nexplanon removed in May Duration of flow: 7-10 days Heavy Menses: yes Clots: no Intermenstrual Bleeding: no Postcoital Bleeding: no Dysmenorrhea: no  The patient is sexually active. She currently uses OCP (estrogen/progesterone) for contraception. She denies dyspareunia.  The patient does perform self breast exams.  There is no notable family history of breast or ovarian cancer in her family.  The patient wears seatbelts: yes.   The patient has regular exercise: she has started boot camp, she is active with her children and she is a Print production planner. She admits healthy lifestyle, diet, hydration and sleep.    The patient denies current symptoms of depression.    Review of Systems: Review of Systems  Constitutional: Negative for chills and fever.  HENT: Negative for congestion, ear discharge, ear pain, hearing loss, sinus pain and sore throat.   Eyes: Negative for blurred vision and double vision.  Respiratory: Negative for cough, shortness of breath and wheezing.   Cardiovascular: Negative for chest pain, palpitations and leg swelling.  Gastrointestinal: Negative for abdominal pain, blood in stool, constipation, diarrhea, heartburn, melena, nausea and vomiting.  Genitourinary: Negative  for dysuria, flank pain, frequency, hematuria and urgency.  Musculoskeletal: Negative for back pain, joint pain and myalgias.  Skin: Negative for itching and rash.  Neurological: Negative for dizziness, tingling, tremors, sensory change, speech change, focal weakness, seizures, loss of consciousness, weakness and headaches.  Endo/Heme/Allergies: Negative for environmental allergies. Does not bruise/bleed easily.       Positive for heavy, irregular periods  Psychiatric/Behavioral: Negative for depression, hallucinations, memory loss, substance abuse and suicidal ideas. The patient is not nervous/anxious and does not have insomnia.     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 Contraception: OCP (estrogen/progesterone) Last Pap: 3 years ago Results were: no abnormalities   Obstetric History: G2P0002  Family History:  Family History  Problem Relation Age of Onset   Hypertension Paternal Grandmother    Diabetes Paternal Grandmother     Social History:  Social History   Socioeconomic History   Marital status: Single    Spouse name: Not on file   Number of children: 2   Years of education: Not on file   Highest education level: Not on file  Occupational History   Not on file  Tobacco Use  Smoking status: Never Smoker   Smokeless tobacco: Never Used  Vaping Use   Vaping Use: Never used  Substance and Sexual Activity   Alcohol use: No   Drug use: No   Sexual activity: Yes    Birth control/protection: Pill    Comment: nexplanon    Other Topics Concern   Not on file  Social History Narrative   Not on file   Social Determinants of Health   Financial Resource Strain:    Difficulty of Paying Living Expenses:   Food Insecurity:    Worried About Charity fundraiser in the Last Year:    Arboriculturist in the Last Year:   Transportation Needs:    Film/video editor (Medical):    Lack of Transportation (Non-Medical):   Physical Activity:    Days of Exercise per Week:    Minutes of Exercise per Session:   Stress:    Feeling of Stress :   Social Connections:    Frequency of Communication with Friends and Family:    Frequency of Social Gatherings with Friends and Family:    Attends Religious Services:    Active Member of Clubs or Organizations:    Attends Archivist Meetings:    Marital Status:   Intimate Partner Violence:    Fear of Current or Ex-Partner:    Emotionally Abused:    Physically Abused:    Sexually Abused:     Allergies:  No Known Allergies  Medications: Prior to Admission medications   Medication Sig Start Date End Date Taking? Authorizing Provider  norelgestromin-ethinyl estradiol (ORTHO EVRA) 150-35 MCG/24HR transdermal patch Place 1 patch onto the skin once a week. 11/02/19   Rod Can, CNM    Physical Exam Vitals: Blood pressure (!) 139/82, pulse 83, height 5\' 6"  (1.676 m), weight 187 lb (84.8 kg).  General: NAD HEENT: normocephalic, anicteric Thyroid: no enlargement, no palpable nodules Pulmonary: No increased work of breathing, CTAB Cardiovascular: RRR, distal pulses 2+ Breast: Breast symmetrical, no tenderness, no palpable nodules or masses, no skin or nipple retraction present, no nipple discharge.  No axillary or supraclavicular lymphadenopathy. Abdomen: NABS, soft, non-tender, non-distended.  Umbilicus without lesions.  No hepatomegaly, splenomegaly or masses palpable. No evidence of hernia  Genitourinary:  External: Normal external  female genitalia.  Normal urethral meatus, normal Bartholin's and Skene's glands.    Vagina: Normal vaginal mucosa, no evidence of prolapse.    Cervix: cervical ectropion present, no bleeding, no CMT  Uterus: Non-enlarged, mobile, normal contour.    Adnexa: ovaries non-enlarged, no adnexal masses  Rectal: deferred  Lymphatic: no evidence of inguinal lymphadenopathy Extremities: no edema, erythema, or tenderness Neurologic: Grossly intact Psychiatric: mood appropriate, affect full  UPT negative today   Assessment: 29 y.o. G2P0002 routine annual exam  Plan: Problem List Items Addressed This Visit    None    Visit Diagnoses    Well woman exam with routine gynecological exam    -  Primary   Relevant Orders   Cytology - PAP   Cervical cancer screening       Relevant Orders   Cytology - PAP   Encounter for initial prescription of transdermal patch hormonal contraceptive device       Relevant Medications   norelgestromin-ethinyl estradiol (ORTHO EVRA) 150-35 MCG/24HR transdermal patch      1) STI screening  was offered and declined  2)  ASCCP guidelines and rationale discussed.  Patient opts for every 3 years screening interval  3) Contraception - the patient is currently using  OCP (estrogen/progesterone).  She is interested in changing to ortho evra patch  4) Routine healthcare maintenance including cholesterol, diabetes screening discussed Declines  5) Return in about 1 year (around 11/01/2020) for annual established gyn.   Rod Can, [facility] 11/02/2019, 11:59 AM

## 2019-11-02 NOTE — Addendum Note (Signed)
Addended by: [place], Nefertiti Mohamad B on: 11/02/2019 02:05 PM   Modules accepted: Orders

## 2019-11-02 NOTE — Patient Instructions (Signed)

## 2019-11-04 LAB — CYTOLOGY - PAP: Diagnosis: NEGATIVE

## 2020-01-25 ENCOUNTER — Encounter: Payer: Self-pay | Admitting: Emergency Medicine

## 2020-01-25 ENCOUNTER — Ambulatory Visit
Admission: EM | Admit: 2020-01-25 | Discharge: 2020-01-25 | Disposition: A | Discharge status: Home / Self Care | Payer: Self-pay | Attending: Family Medicine | Admitting: Family Medicine

## 2020-01-25 ENCOUNTER — Other Ambulatory Visit: Payer: Self-pay

## 2020-01-25 DIAGNOSIS — J039 Acute tonsillitis, unspecified: Secondary | ICD-10-CM | POA: Insufficient documentation

## 2020-01-25 DIAGNOSIS — J029 Acute pharyngitis, unspecified: Secondary | ICD-10-CM | POA: Insufficient documentation

## 2020-01-25 LAB — GROUP A STREP BY PCR: Group A Strep by PCR: NOT DETECTED

## 2020-01-25 MED ORDER — PREDNISONE 20 MG PO TABS: 20.0000 mg | ORAL_TABLET | Freq: Every day | ORAL | 0 refills | Status: AC

## 2020-01-25 MED ORDER — AZITHROMYCIN 250 MG PO TABS: 250.0000 mg | ORAL_TABLET | Freq: Every day | ORAL | 0 refills | Status: DC

## 2020-01-25 NOTE — ED Provider Notes (Signed)
[facility]    CSN: [id number] Arrival date & time: 01/25/20  1416      History   Chief Complaint Chief Complaint  Patient presents with  . Sore Throat    HPI Monica Moran is a 29 y.o. female presenting for sore throat since yesterday.  She states that she noticed some white patches on her tonsils today.  Patient denies any other associated symptoms.  Patient denies fever, fatigue, body aches, cough, congestion, headaches, chest pain or breathing difficulty.  Denies any abdominal discomfort, nausea, vomiting or changes in smell or taste.  She has a personal history of COVID-19 sometime in 2020.  Patient not vaccinated for COVID-19.  She has no known exposure.  Patient is a Radio producer.  Has not taken any over-the-counter medication for symptoms.  No other complaints or concerns.  HPI  History reviewed. No pertinent past medical history.  Patient Active Problem List   Diagnosis Date Noted  . 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 Uses contraceptive implants for birth control 06/13/2016  . 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 NO PAST SURGERIES    . NO PAST SURGERIES      OB History    Gravida  2   Para  2   Term  0   Preterm      AB      Living  2     SAB      TAB      Ectopic      Multiple      Live Births  2            Home Medications    Prior to Admission medications   Medication Sig Start Date End Date Taking? Authorizing Provider  azithromycin (ZITHROMAX) 250 MG tablet Take 1 tablet (250 mg total) by mouth daily. Take first 2 tablets together, then 1 every day until finished. 01/25/20   Danton Clap, PA-C  norelgestromin-ethinyl estradiol (ORTHO EVRA) 150-35 MCG/24HR transdermal patch Place 1 patch onto the skin once a week. 11/02/19   Rod Can, CNM  predniSONE  (DELTASONE) 20 MG tablet Take 1 tablet (20 mg total) by mouth daily for 5 days. 01/25/20 01/30/20  Danton Clap, PA-C    Family History Family History  Problem Relation Age of Onset  . Hypertension Paternal Grandmother   . Diabetes Paternal Grandmother     Social History Social History   Tobacco Use  . Smoking status: Never Smoker  . Smokeless tobacco: Never Used  Vaping Use  . Vaping Use: Never used  Substance Use Topics  . Alcohol use: No  . Drug use: No     Allergies   Patient has no known allergies.   Review of Systems Review of Systems  Constitutional: Negative for chills, diaphoresis, fatigue and fever.  HENT: Positive for sore throat. Negative for congestion, ear pain, rhinorrhea, sinus pressure and sinus pain.   Respiratory: Negative for cough and shortness of breath.   Gastrointestinal: Negative for abdominal pain, nausea and vomiting.  Musculoskeletal: Negative for arthralgias and myalgias.  Skin: Negative for rash.  Neurological: Negative for weakness and headaches.  Hematological: Negative for adenopathy.     Physical Exam Triage Vital Signs ED Triage Vitals  Enc Vitals Group     BP 01/25/20 1437 120/76     Pulse Rate 01/25/20 1437  86     Resp 01/25/20 1437 18     Temp 01/25/20 1437 98.1 F (36.7 C)     Temp Source 01/25/20 1437 Oral     SpO2 01/25/20 1437 100 %     Weight 01/25/20 1435 180 lb (81.6 kg)     Height 01/25/20 1435 5\' 6"  (1.676 m)     Head Circumference --      Peak Flow --      Pain Score 01/25/20 1435 8     Pain Loc --      Pain Edu? --      Excl. in [place]? --    No data found.  Updated Vital Signs BP 120/76 (BP Location: Right Arm)   Pulse 86   Temp 98.1 F (36.7 C) (Oral)   Resp 18   Ht 5\' 6"  (1.676 m)   Wt 180 lb (81.6 kg)   LMP 01/05/2020   SpO2 100%   BMI 29.05 kg/m       Physical Exam Vitals and nursing note reviewed.  Constitutional:      General: She is not in acute distress.    Appearance: Normal  appearance. She is not ill-appearing or toxic-appearing.  HENT:     Head: Normocephalic and atraumatic.     Nose: Nose normal.     Mouth/Throat:     Mouth: Mucous membranes are moist.     Pharynx: Oropharynx is clear.     Tonsils: Tonsillar exudate (bilateral whitish exudates) present. 2+ on the right. 2+ on the left.  Eyes:     General: No scleral icterus.       Right eye: No discharge.        Left eye: No discharge.     Conjunctiva/sclera: Conjunctivae normal.  Cardiovascular:     Rate and Rhythm: Normal rate and regular rhythm.     Heart sounds: Normal heart sounds.  Pulmonary:     Effort: Pulmonary effort is normal. No respiratory distress.     Breath sounds: Normal breath sounds.  Musculoskeletal:     Cervical back: Neck supple.  Lymphadenopathy:     Cervical: Cervical adenopathy (anterior ) present.  Skin:    General: Skin is dry.  Neurological:     General: No focal deficit present.     Mental Status: She is alert. Mental status is at baseline.     Motor: No weakness.     Gait: Gait normal.  Psychiatric:        Mood and Affect: Mood normal.        Behavior: Behavior normal.        Thought Content: Thought content normal.      UC Treatments / Results  Labs (all labs ordered are listed, but only abnormal results are displayed) Labs Reviewed  GROUP A STREP BY PCR    EKG   Radiology No results found.  Procedures Procedures (including critical care time)  Medications Ordered in UC Medications - No data to display  Initial Impression / Assessment and Plan / UC Course  I have reviewed the triage vital signs and the nursing notes.  Pertinent labs & imaging results that were available during my care of the patient were reviewed by me and considered in my medical decision making (see chart for details).   Molecular Covid test negative today.  Patient declines Covid testing today.  Treating for other bacterial causes of tonsillitis given presentation.   Treating with azithromycin.  Treating with prednisone as well  since her tonsils are very swollen.  Advised increasing rest and fluids.  Advised getting a Covid test if she is not sure he felt better within 2 to 3 days of starting this medication or if she develops any other symptoms.  Patient is agreeable.  Final Clinical Impressions(s) / UC Diagnoses   Final diagnoses:  Acute tonsillitis, unspecified etiology  Sore throat     Discharge Instructions     Rapid strep test is negative.  We are treating you with antibiotics at this time for other strains of strep or other bacterial causes of tonsillitis.  There is a possibility that this is viral tonsillitis and Covid is not ruled out without a test.  If you are not feeling better over the next 3 days or you develop any fever, cough or worsening symptoms you should get tested for Covid.    ED Prescriptions    Medication Sig Dispense Auth. Provider   azithromycin (ZITHROMAX) 250 MG tablet Take 1 tablet (250 mg total) by mouth daily. Take first 2 tablets together, then 1 every day until finished. 6 tablet Laurene Footman B, PA-C   predniSONE (DELTASONE) 20 MG tablet Take 1 tablet (20 mg total) by mouth daily for 5 days. 5 tablet Gretta Cool     PDMP not reviewed this encounter.   Danton Clap, PA-C 01/25/20 1528

## 2020-01-25 NOTE — Discharge Instructions (Addendum)
Rapid strep test is negative.  We are treating you with antibiotics at this time for other strains of strep or other bacterial causes of tonsillitis.  There is a possibility that this is viral tonsillitis and Covid is not ruled out without a test.  If you are not feeling better over the next 3 days or you develop any fever, cough or worsening symptoms you should get tested for Covid.

## 2020-01-25 NOTE — ED Triage Notes (Signed)
Patient c/o sore throat that started yesterday. Denies any other symptoms.

## 2020-02-17 DIAGNOSIS — O26891 Other specified pregnancy related conditions, first trimester: Secondary | ICD-10-CM | POA: Diagnosis not present

## 2020-02-17 DIAGNOSIS — D270 Benign neoplasm of right ovary: Secondary | ICD-10-CM | POA: Diagnosis not present

## 2020-02-17 DIAGNOSIS — O99891 Other specified diseases and conditions complicating pregnancy: Secondary | ICD-10-CM | POA: Diagnosis not present

## 2020-02-17 DIAGNOSIS — O219 Vomiting of pregnancy, unspecified: Secondary | ICD-10-CM | POA: Diagnosis not present

## 2020-02-17 DIAGNOSIS — N83291 Other ovarian cyst, right side: Secondary | ICD-10-CM | POA: Diagnosis not present

## 2020-02-17 DIAGNOSIS — Z3A01 Less than 8 weeks gestation of pregnancy: Secondary | ICD-10-CM | POA: Diagnosis not present

## 2020-02-17 DIAGNOSIS — R109 Unspecified abdominal pain: Secondary | ICD-10-CM | POA: Diagnosis not present

## 2020-02-17 DIAGNOSIS — R8271 Bacteriuria: Secondary | ICD-10-CM | POA: Diagnosis not present

## 2020-02-17 DIAGNOSIS — O3481 Maternal care for other abnormalities of pelvic organs, first trimester: Secondary | ICD-10-CM | POA: Diagnosis not present

## 2020-03-22 DIAGNOSIS — O3680X0 Pregnancy with inconclusive fetal viability, not applicable or unspecified: Secondary | ICD-10-CM | POA: Diagnosis not present

## 2020-03-22 DIAGNOSIS — Z3A10 10 weeks gestation of pregnancy: Secondary | ICD-10-CM | POA: Diagnosis not present

## 2020-04-07 DIAGNOSIS — U071 COVID-19: Secondary | ICD-10-CM | POA: Diagnosis not present

## 2020-04-07 DIAGNOSIS — O98512 Other viral diseases complicating pregnancy, second trimester: Secondary | ICD-10-CM | POA: Diagnosis not present

## 2020-04-07 DIAGNOSIS — Z3A15 15 weeks gestation of pregnancy: Secondary | ICD-10-CM | POA: Diagnosis not present

## 2020-04-07 DIAGNOSIS — N390 Urinary tract infection, site not specified: Secondary | ICD-10-CM | POA: Diagnosis not present

## 2020-05-10 DIAGNOSIS — Z3A19 19 weeks gestation of pregnancy: Secondary | ICD-10-CM | POA: Diagnosis not present

## 2020-05-10 DIAGNOSIS — Z363 Encounter for antenatal screening for malformations: Secondary | ICD-10-CM | POA: Diagnosis not present

## 2020-06-07 DIAGNOSIS — Z3A22 22 weeks gestation of pregnancy: Secondary | ICD-10-CM | POA: Diagnosis not present

## 2020-06-07 DIAGNOSIS — O358XX0 Maternal care for other (suspected) fetal abnormality and damage, not applicable or unspecified: Secondary | ICD-10-CM | POA: Diagnosis not present

## 2020-06-07 DIAGNOSIS — Z362 Encounter for other antenatal screening follow-up: Secondary | ICD-10-CM | POA: Diagnosis not present

## 2020-06-08 ENCOUNTER — Other Ambulatory Visit: Payer: Self-pay

## 2020-06-08 ENCOUNTER — Ambulatory Visit (LOCAL_COMMUNITY_HEALTH_CENTER): Payer: Self-pay

## 2020-06-08 DIAGNOSIS — Z111 Encounter for screening for respiratory tuberculosis: Secondary | ICD-10-CM

## 2020-06-08 DIAGNOSIS — O358XX0 Maternal care for other (suspected) fetal abnormality and damage, not applicable or unspecified: Secondary | ICD-10-CM | POA: Diagnosis not present

## 2020-06-11 ENCOUNTER — Other Ambulatory Visit: Payer: Self-pay

## 2020-06-11 ENCOUNTER — Ambulatory Visit (LOCAL_COMMUNITY_HEALTH_CENTER): Payer: Medicaid Other

## 2020-06-11 DIAGNOSIS — Z111 Encounter for screening for respiratory tuberculosis: Secondary | ICD-10-CM

## 2020-06-11 LAB — TB SKIN TEST
Induration: 0 mm
TB Skin Test: NEGATIVE

## 2020-06-21 ENCOUNTER — Other Ambulatory Visit: Payer: Self-pay

## 2020-07-20 DIAGNOSIS — Z3482 Encounter for supervision of other normal pregnancy, second trimester: Secondary | ICD-10-CM | POA: Diagnosis not present

## 2020-08-16 DIAGNOSIS — O358XX0 Maternal care for other (suspected) fetal abnormality and damage, not applicable or unspecified: Secondary | ICD-10-CM | POA: Diagnosis not present

## 2020-08-16 DIAGNOSIS — Z3A33 33 weeks gestation of pregnancy: Secondary | ICD-10-CM | POA: Diagnosis not present

## 2020-10-11 DIAGNOSIS — Z3A41 41 weeks gestation of pregnancy: Secondary | ICD-10-CM | POA: Diagnosis not present

## 2020-10-11 DIAGNOSIS — O480 Post-term pregnancy: Secondary | ICD-10-CM | POA: Diagnosis not present

## 2020-10-11 DIAGNOSIS — D573 Sickle-cell trait: Secondary | ICD-10-CM | POA: Diagnosis not present

## 2020-10-11 DIAGNOSIS — Z20822 Contact with and (suspected) exposure to covid-19: Secondary | ICD-10-CM | POA: Diagnosis not present

## 2020-10-11 DIAGNOSIS — O9902 Anemia complicating childbirth: Secondary | ICD-10-CM | POA: Diagnosis not present

## 2020-10-11 DIAGNOSIS — O471 False labor at or after 37 completed weeks of gestation: Secondary | ICD-10-CM | POA: Diagnosis not present

## 2020-11-23 IMAGING — CT CT RENAL STONE PROTOCOL
1 of 2 series · 14 of 32 positions shown, 18 images · non-contrast
Comparison: Obstetric ultrasound 02/01/2011

CLINICAL DATA: Dysuria with left flank pain for 2 weeks. On
antibiotics.

EXAM:
CT ABDOMEN AND PELVIS WITHOUT CONTRAST
TECHNIQUE: Multidetector CT imaging of the abdomen and pelvis was performed
following the standard protocol without IV contrast.

[Series 2: axial st · axial · 0.64mm/px · z∈[-938,-543]mm · 14 of 87 slices shown, 18 images]
[im 4/87  soft-tissue]
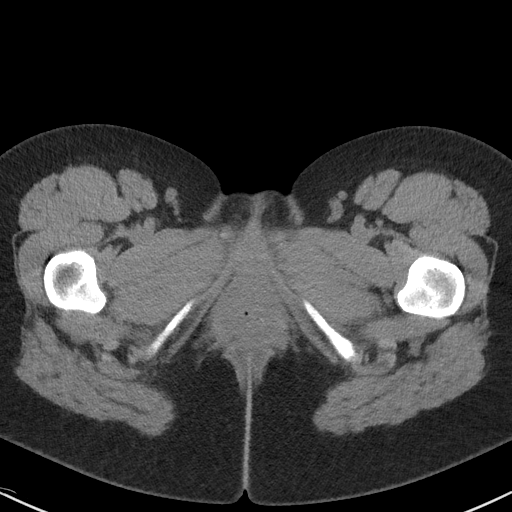
[im 4/87  bone]
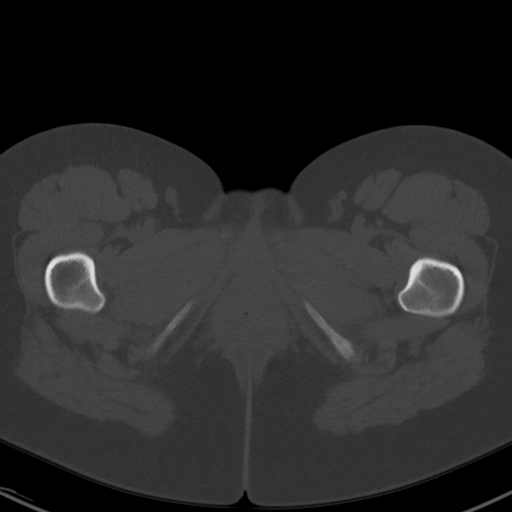
[im 11/87  soft-tissue]
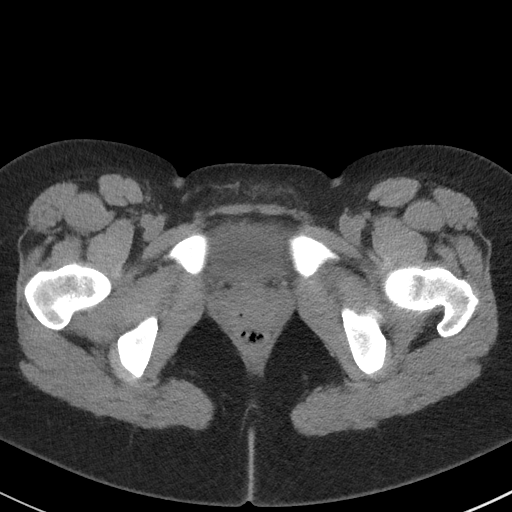
[im 18/87  soft-tissue]
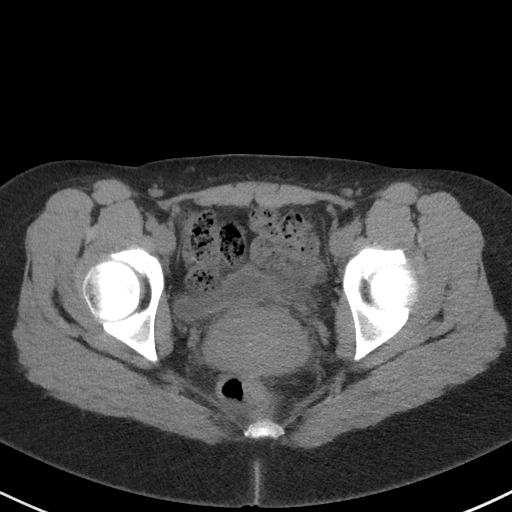
[im 26/87  soft-tissue]
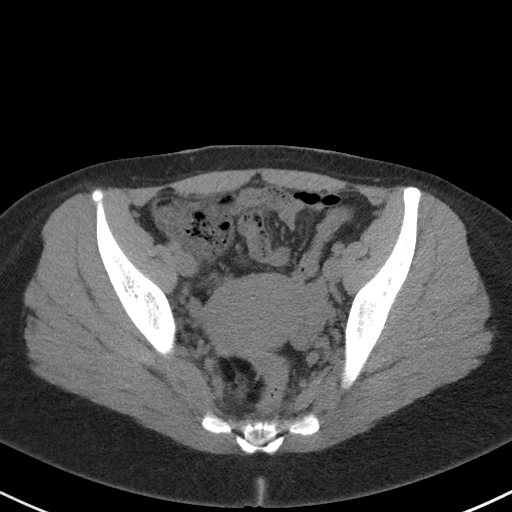
[im 33/87  soft-tissue]
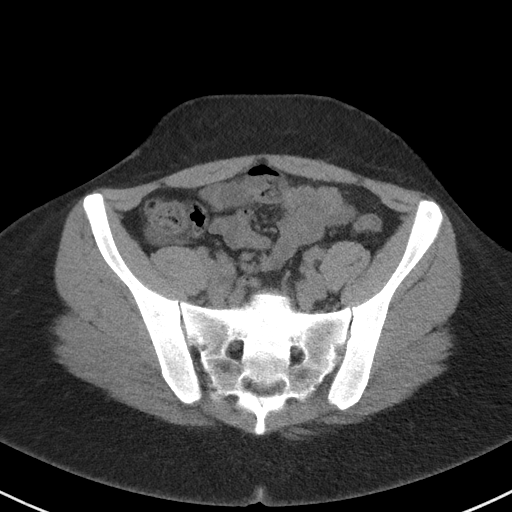
[im 40/87  soft-tissue]
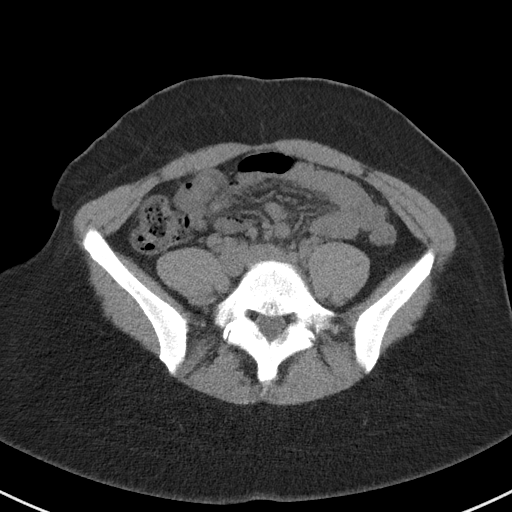
[im 47/87  soft-tissue]
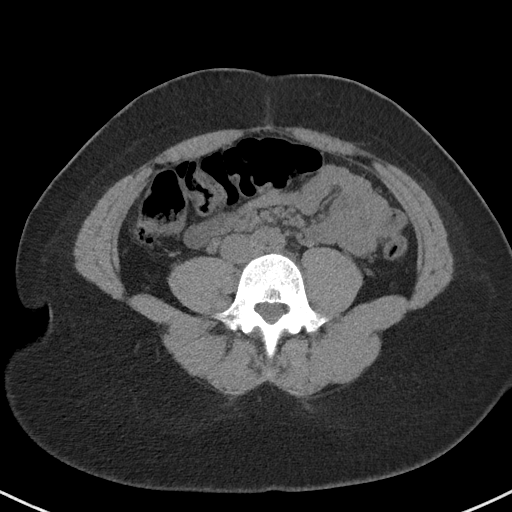
[im 54/87  soft-tissue]
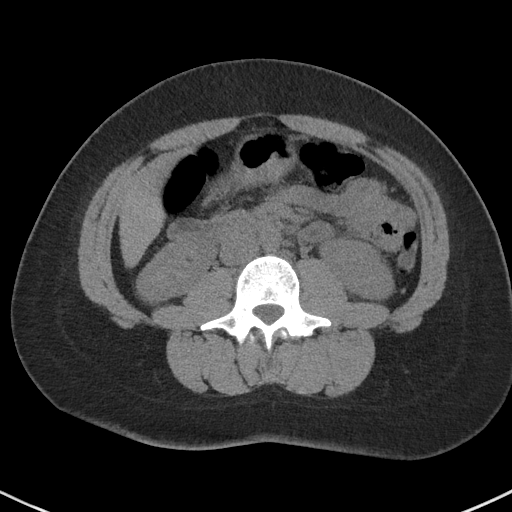
[im 61/87  soft-tissue]
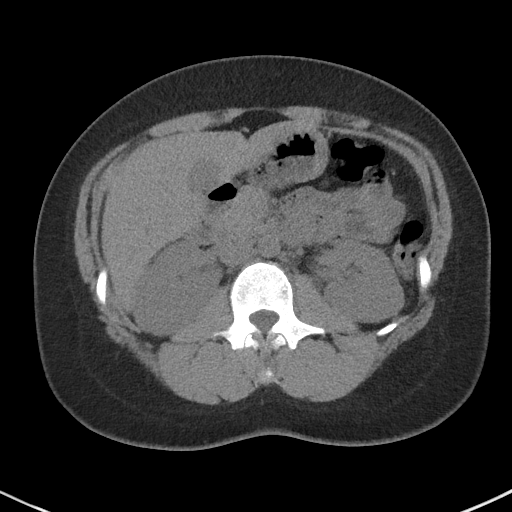
[im 61/87  bone]
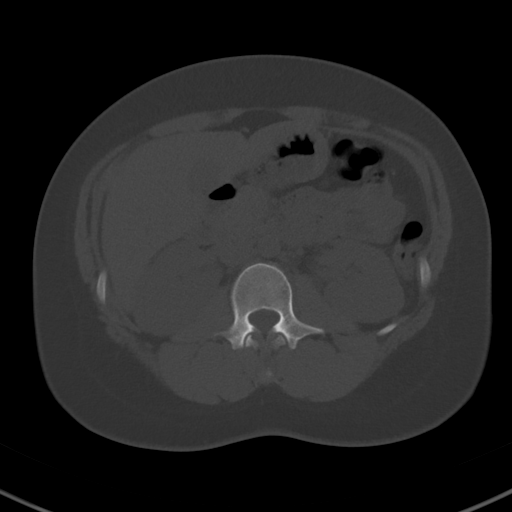
[im 69/87  soft-tissue]
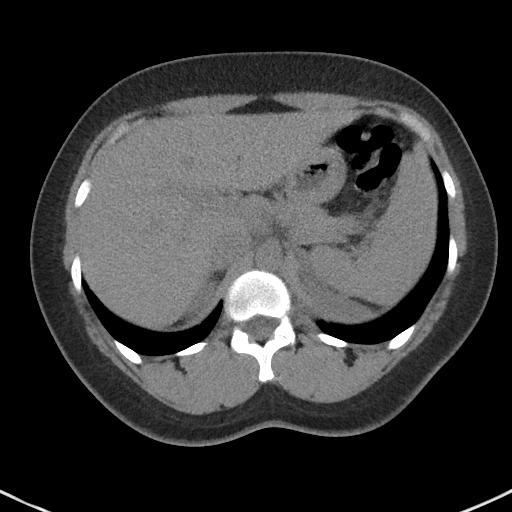
[im 72/87  lung]
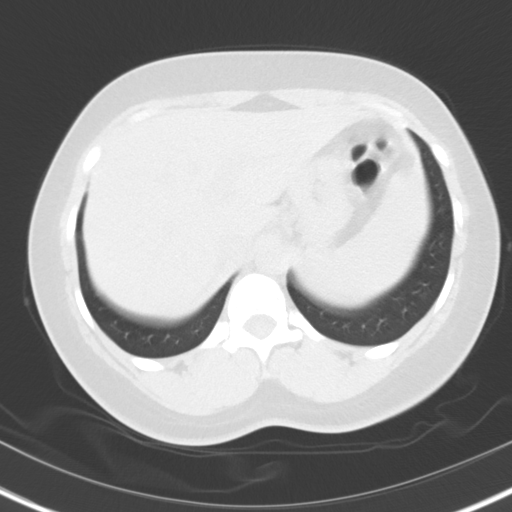
[im 76/87  soft-tissue]
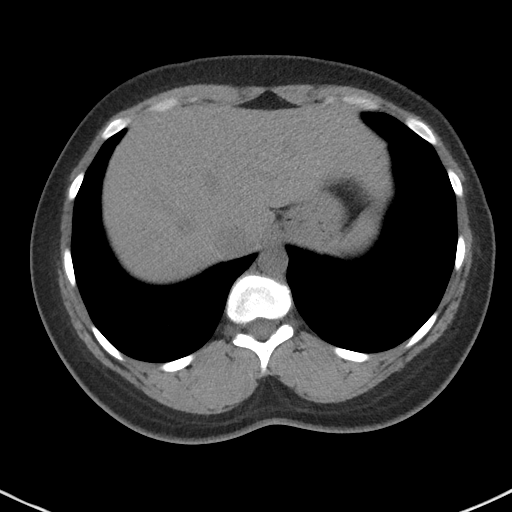
[im 76/87  lung]
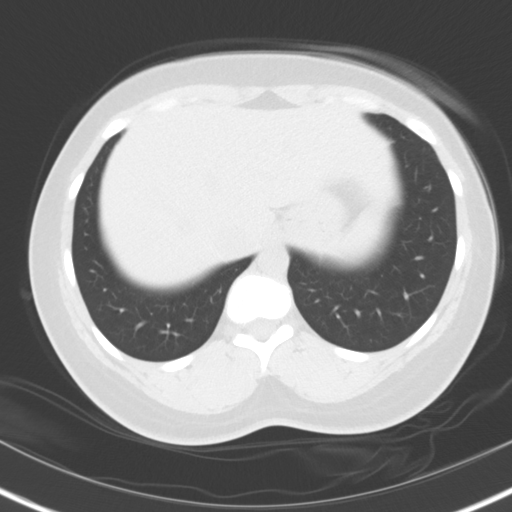
[im 79/87  lung]
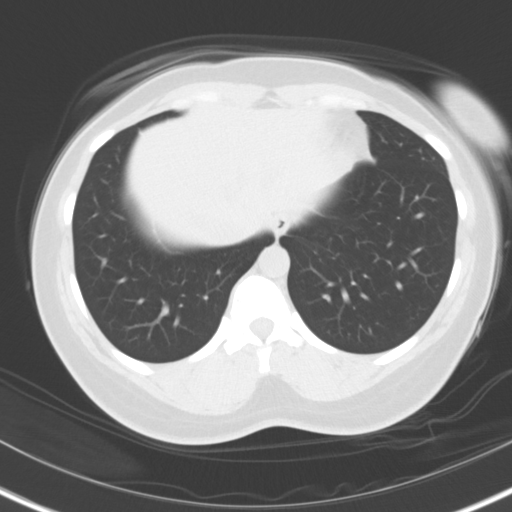
[im 83/87  soft-tissue]
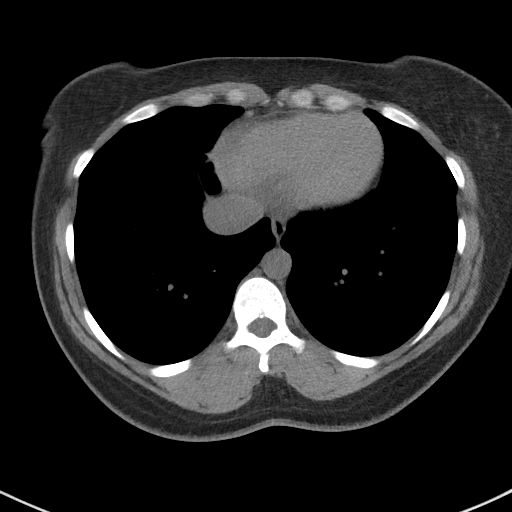
[im 83/87  lung]
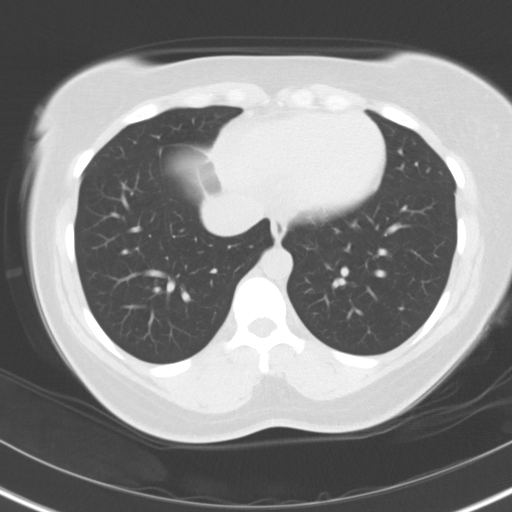

[14 of 32 positions shown; findings below may reference images not displayed]

FINDINGS: Lower chest: Clear lung bases. No significant pleural or pericardial
effusion.

Hepatobiliary: No focal hepatic abnormalities identified on
noncontrast imaging. No evidence of gallstones, gallbladder wall
thickening or biliary dilatation.

Pancreas: Unremarkable. No pancreatic ductal dilatation or
surrounding inflammatory changes.

Spleen: Normal in size without focal abnormality.

Adrenals/Urinary Tract: Both adrenal glands appear normal. No
evidence of urinary tract calculus or hydronephrosis. The kidneys,
ureters and bladder appear unremarkable as imaged in the noncontrast
state.

Stomach/Bowel: No evidence of bowel wall thickening, distention or
surrounding inflammatory change. The appendix appears normal.

Vascular/Lymphatic: No significant vascular findings on noncontrast
imaging. No enlarged abdominopelvic lymph nodes are seen.

Reproductive: Fatty mass in the posterior right adnexa measures
x 2.9 cm on image 64/2, likely a right ovarian dermoid. Remote
obstetric ultrasound suggested a small right ovarian dermoid
measuring approximately 2 cm. The uterus and left ovary appear
normal.

Other: No ascites or free air.  Intact intraabdominal wall.

Musculoskeletal: No acute or significant osseous findings. Small
bone island posterolaterally in the left iliac bone.
IMPRESSION: 1. No acute findings or explanation for the patient's symptoms. No
evidence of urinary tract calculus or hydronephrosis.
2. 5 cm fatty mass in the posterior right adnexa, likely a right
ovarian dermoid. Remote obstetric ultrasound suggested a small right
ovarian dermoid measuring approximately 2 cm. Gynecology follow-up
recommended. Annual pelvic ultrasound follow-up recommended if not
surgically removed. This recommendation follows ACR consensus
guidelines: White Paper of the ACR Incidental Findings Committee II
on Adnexal Findings. [HOSPITAL] [DATE].

## 2020-12-14 DIAGNOSIS — Z1389 Encounter for screening for other disorder: Secondary | ICD-10-CM | POA: Diagnosis not present

## 2021-02-22 ENCOUNTER — Encounter: Payer: Self-pay | Admitting: Emergency Medicine

## 2021-02-22 ENCOUNTER — Other Ambulatory Visit: Payer: Self-pay

## 2021-02-22 ENCOUNTER — Ambulatory Visit
Admission: EM | Admit: 2021-02-22 | Discharge: 2021-02-22 | Disposition: A | Discharge status: Home / Self Care | Payer: Medicaid Other | Attending: Emergency Medicine | Admitting: Emergency Medicine

## 2021-02-22 DIAGNOSIS — J069 Acute upper respiratory infection, unspecified: Secondary | ICD-10-CM | POA: Insufficient documentation

## 2021-02-22 LAB — GROUP A STREP BY PCR: Group A Strep by PCR: NOT DETECTED

## 2021-02-22 MED ORDER — FLUTICASONE PROPIONATE 50 MCG/ACT NA SUSP: 2.0000 | Freq: Every day | NASAL | 1 refills | Status: AC

## 2021-02-22 NOTE — Discharge Instructions (Signed)
Use Flonase nasal spray, 2 squirts in each nostril at bedtime. Follow each pair of squirts with a squirt of nasal saline to push the particles further up your nose where they will take effect.  Use Plain Robitussin every 6 hours as needed for cough.  Tylenol as needed for fever and pain.  Return for re-evaluation of any new or worsening symptoms.

## 2021-02-22 NOTE — ED Triage Notes (Signed)
Patient states that she had bodyaches, cough, and low grade fevers that started last Sunday.  Patient states since then she developed a sore throat, right ear pain and headache 3 days ago.  Patient denies recent fevers.

## 2021-02-22 NOTE — ED Provider Notes (Signed)
[facility]    CSN: [id number] Arrival date & time: 02/22/21  1323      History   Chief Complaint Chief Complaint  Patient presents with   Sore Throat   Otalgia   Headache    HPI PETE MERTEN is a 30 y.o. female.   HPI  31 year old female here for evaluation of respiratory complaints.  Patient reports that for the last 5 days she has been experiencing body aches, cough, and a low-grade fever with a T-max of 100.3.  3 days ago she developed sore throat, right ear pain, and headache.  Patient is currently breast-feeding.  History reviewed. No pertinent past medical history.  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 Authorizing Provider  fluticasone (FLONASE) 50 MCG/ACT nasal spray Place 2 sprays into both nostrils daily. 02/22/21  Yes Margarette Canada, NP    Family History Family History  Problem Relation Age of Onset   Hypertension Paternal Grandmother    Diabetes Paternal Grandmother     Social History Social History   Tobacco Use   Smoking status: Never   Smokeless tobacco: Never  Vaping Use   Vaping Use: Never used  Substance Use Topics   Alcohol use: No   Drug use: No     Allergies   Patient has no known allergies.   Review of Systems Review of Systems  Constitutional:  Positive for fever. Negative for activity  change and appetite change.  HENT:  Positive for congestion, ear pain, rhinorrhea and sore throat.   Respiratory:  Positive for cough. Negative for shortness of breath and wheezing.   Gastrointestinal:  Negative for diarrhea, nausea and vomiting.  Musculoskeletal:  Positive for arthralgias and myalgias.  Skin:  Negative for rash.  Neurological:  Positive for headaches.  Hematological: Negative.   Psychiatric/Behavioral: Negative.      Physical Exam Triage Vital Signs ED Triage Vitals  Enc Vitals Group     BP 02/22/21 1434 (!) 136/102     Pulse Rate 02/22/21 1434 88     Resp 02/22/21 1434 14     Temp 02/22/21 1434 98.9 F (37.2 C)     Temp Source 02/22/21 1434 Oral     SpO2 --      Weight 02/22/21 1430 180 lb (81.6 kg)     Height 02/22/21 1430 5\' 6"  (1.676 m)     Head Circumference --      Peak Flow --      Pain Score 02/22/21 1429 7     Pain Loc --  Pain Edu? --      Excl. in [place]? --    No data found.  Updated Vital Signs BP (!) 136/102 (BP Location: Right Arm)   Pulse 88   Temp 98.9 F (37.2 C) (Oral)   Resp 14   Ht 5\' 6"  (1.676 m)   Wt 180 lb (81.6 kg)   LMP 02/05/2021 (Exact Date)   Breastfeeding Yes   BMI 29.05 kg/m   Visual Acuity Right Eye Distance:   Left Eye Distance:   Bilateral Distance:    Right Eye Near:   Left Eye Near:    Bilateral Near:     Physical Exam Vitals and nursing note reviewed.  Constitutional:      General: She is not in acute distress.    Appearance: Normal appearance. She is not ill-appearing.  HENT:     Head: Normocephalic and atraumatic.     Right Ear: Tympanic membrane, ear canal and external ear normal. There is no impacted cerumen.     Left Ear: Tympanic membrane, ear canal and external ear normal. There is no impacted cerumen.     Nose: Congestion and rhinorrhea present.     Mouth/Throat:     Mouth: Mucous membranes are moist.     Pharynx: Oropharynx is clear.  Cardiovascular:     Rate and Rhythm: Normal rate  and regular rhythm.     Pulses: Normal pulses.     Heart sounds: Normal heart sounds. No murmur heard.   No gallop.  Pulmonary:     Effort: Pulmonary effort is normal.     Breath sounds: Normal breath sounds. No wheezing, rhonchi or rales.  Musculoskeletal:     Cervical back: Normal range of motion and neck supple.  Lymphadenopathy:     Cervical: No cervical adenopathy.  Skin:    General: Skin is warm and dry.     Capillary Refill: Capillary refill takes less than 2 seconds.     Findings: No erythema or rash.  Neurological:     General: No focal deficit present.     Mental Status: She is alert and oriented to person, place, and time.  Psychiatric:        Mood and Affect: Mood normal.        Behavior: Behavior normal.        Thought Content: Thought content normal.        Judgment: Judgment normal.     UC Treatments / Results  Labs (all labs ordered are listed, but only abnormal results are displayed) Labs Reviewed  GROUP A STREP BY PCR    EKG   Radiology No results found.  Procedures Procedures (including critical care time)  Medications Ordered in UC Medications - No data to display  Initial Impression / Assessment and Plan / UC Course  I have reviewed the triage vital signs and the nursing notes.  Pertinent labs & imaging results that were available during my care of the patient were reviewed by me and considered in my medical decision making (see chart for details).  Patient is a pleasant, nontoxic-appearing 30 year old female here for evaluation of respiratory complaints as outlined in HPI above.  Patient is currently breast-feeding as well.  Patient's physical exam reveals protegrin tympanic membranes bilaterally with normal light reflex and clear external auditory canals.  Nasal mucosa is erythematous and edematous with clear nasal discharge in both nares.  Oropharyngeal exam reveals posterior oropharyngeal erythema and injection with clear postnasal drip.   Tonsillar pillars are erythematous  but they are free of exudate.  There are 1+ edematous as well.  No cervical lymphadenopathy appreciated on exam.  Cardiopulmonary exam reveals clear lung sounds in all fields.  Patient's exam is consistent with an upper respiratory infection and cough.  Patient was swabbed for strep at triage and the strep PCR is negative.  Patient breast-feeding I have discussed with her that choice of therapies is limited.  She can use Flonase nasal spray at bedtime and plain over-the-counter Robitussin as needed for cough.  Return and ER precautions reviewed with patient.   Final Clinical Impressions(s) / UC Diagnoses   Final diagnoses:  Viral URI with cough     Discharge Instructions      Use Flonase nasal spray, 2 squirts in each nostril at bedtime. Follow each pair of squirts with a squirt of nasal saline to push the particles further up your nose where they will take effect.  Use Plain Robitussin every 6 hours as needed for cough.  Tylenol as needed for fever and pain.  Return for re-evaluation of any new or worsening symptoms.      ED Prescriptions     Medication Sig Dispense Auth. Provider   fluticasone (FLONASE) 50 MCG/ACT nasal spray Place 2 sprays into both nostrils daily. 18.2 mL Margarette Canada, NP      PDMP not reviewed this encounter.   Margarette Canada, NP 02/22/21 1616

## 2021-04-18 ENCOUNTER — Emergency Department
Admission: EM | Admit: 2021-04-18 | Discharge: 2021-04-18 | Disposition: A | Discharge status: Home / Self Care | Payer: No Typology Code available for payment source | Attending: Emergency Medicine | Admitting: Emergency Medicine

## 2021-04-18 ENCOUNTER — Encounter: Payer: Self-pay | Admitting: Emergency Medicine

## 2021-04-18 ENCOUNTER — Other Ambulatory Visit: Payer: Self-pay

## 2021-04-18 DIAGNOSIS — Y92410 Unspecified street and highway as the place of occurrence of the external cause: Secondary | ICD-10-CM | POA: Insufficient documentation

## 2021-04-18 DIAGNOSIS — G44319 Acute post-traumatic headache, not intractable: Secondary | ICD-10-CM | POA: Diagnosis not present

## 2021-04-18 DIAGNOSIS — R52 Pain, unspecified: Secondary | ICD-10-CM | POA: Diagnosis not present

## 2021-04-18 DIAGNOSIS — R69 Illness, unspecified: Secondary | ICD-10-CM | POA: Diagnosis not present

## 2021-04-18 DIAGNOSIS — R519 Headache, unspecified: Secondary | ICD-10-CM | POA: Diagnosis present

## 2021-04-18 NOTE — ED Notes (Signed)
Pt in NAD.  Ambulatory. Moves all extremities without difficulty. Neuro intact
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
## 2021-04-19 ENCOUNTER — Telehealth: Payer: Self-pay

## 2021-04-19 NOTE — Telephone Encounter (Signed)
Transition Care Management Unsuccessful Follow-up Telephone Call  Date of discharge and from where:  04/18/2021-[facility]  Attempts:  1st Attempt  Reason for unsuccessful TCM follow-up call:  Unable to leave message

## 2021-04-20 NOTE — Telephone Encounter (Signed)
Transition Care Management Unsuccessful Follow-up Telephone Call  Date of discharge and from where:  04/18/2021-[facility]  Attempts:  2nd Attempt  Reason for unsuccessful TCM follow-up call:  Unable to leave message

## 2021-04-21 NOTE — Telephone Encounter (Signed)
Transition Care Management Unsuccessful Follow-up Telephone Call  Date of discharge and from where:  04/18/2021-[facility]  Attempts:  3rd Attempt  Reason for unsuccessful TCM follow-up call:  Unable to leave message

## 2021-08-28 DIAGNOSIS — Z30013 Encounter for initial prescription of injectable contraceptive: Secondary | ICD-10-CM | POA: Diagnosis not present

## 2021-11-05 ENCOUNTER — Ambulatory Visit (LOCAL_COMMUNITY_HEALTH_CENTER): Payer: Self-pay

## 2021-11-05 DIAGNOSIS — Z111 Encounter for screening for respiratory tuberculosis: Secondary | ICD-10-CM

## 2021-11-07 ENCOUNTER — Ambulatory Visit: Payer: Medicaid Other

## 2021-11-08 ENCOUNTER — Encounter: Payer: Self-pay | Admitting: Nurse Practitioner

## 2021-11-08 ENCOUNTER — Ambulatory Visit (LOCAL_COMMUNITY_HEALTH_CENTER): Payer: Medicaid Other

## 2021-11-08 ENCOUNTER — Ambulatory Visit (LOCAL_COMMUNITY_HEALTH_CENTER): Payer: Medicaid Other | Admitting: Nurse Practitioner

## 2021-11-08 VITALS — BP 135/90 | Ht 68.0 in | Wt 173.2 lb

## 2021-11-08 DIAGNOSIS — Z3042 Encounter for surveillance of injectable contraceptive: Secondary | ICD-10-CM

## 2021-11-08 DIAGNOSIS — Z3009 Encounter for other general counseling and advice on contraception: Secondary | ICD-10-CM

## 2021-11-08 DIAGNOSIS — Z309 Encounter for contraceptive management, unspecified: Secondary | ICD-10-CM | POA: Diagnosis not present

## 2021-11-08 DIAGNOSIS — Z111 Encounter for screening for respiratory tuberculosis: Secondary | ICD-10-CM

## 2021-11-08 LAB — TB SKIN TEST
Induration: 0 mm
TB Skin Test: NEGATIVE

## 2021-11-08 MED ORDER — MEDROXYPROGESTERONE ACETATE 150 MG/ML IM SUSP
150.0000 mg | Freq: Once | INTRAMUSCULAR | Status: AC
  Administered 2021-11-08: 150 mg via INTRAMUSCULAR

## 2021-11-08 NOTE — Progress Notes (Signed)
Pt had Post partum visit on 12/14/2020.  Last Depo was given on 5/24.2023 at [facility].  Depo 150 mg given IM in Lt deltoid.  Pt given reminder card to return in 11-13 weeks for next Depo injection.  FP packet given.  Windle Guard, RN

## 2021-11-08 NOTE — Progress Notes (Addendum)
[place] problem visit  [place] [facility]  Subjective:  Monica Moran is a 31 y.o. being seen today for a physical and to receive a Depo.  Patient states she had postpartum exam 12/2020 and received Depo on 08/28/2021 through [facility].    Chief Complaint  Patient presents with   Contraception    Physical and depo     HPI   Does the patient have a current or past history of drug use? Yes   No components found for: "HCV"]   Health Maintenance Due  Topic Date Due   HIV Screening  Never done   Hepatitis C Screening  Never done   TETANUS/TDAP  Never done   INFLUENZA VACCINE  11/05/2021    Review of Systems  Constitutional:  Negative for chills, fever, malaise/fatigue and weight loss.  HENT:  Negative for congestion, hearing loss and sore throat.   Eyes:  Negative for blurred vision, double vision and photophobia.  Respiratory:  Negative for shortness of breath.   Cardiovascular:  Negative for chest pain.  Gastrointestinal:  Negative for abdominal pain, blood in stool, constipation, diarrhea, heartburn, nausea and vomiting.  Genitourinary:  Negative for dysuria and frequency.  Musculoskeletal:  Negative for back pain, joint pain and neck pain.  Skin:  Negative for itching and rash.  Neurological:  Negative for dizziness, weakness and headaches.  Endo/Heme/Allergies:  Does not bruise/bleed easily.  Psychiatric/Behavioral:  Negative for depression, substance abuse and suicidal ideas.     The following portions of the patient's history were reviewed and updated as appropriate: allergies, current medications, past family history, past medical history, past social history, past surgical history and problem list. Problem list updated.   See flowsheet for other program required questions.  Objective:   Vitals:   11/08/21 1418  BP: (!) 135/90  Weight: 173 lb 3.2 oz (78.6 kg)  Height: '5\' 8"'$  (1.727 m)    Physical  Exam Constitutional:      Appearance: Normal appearance.  HENT:     Head: Normocephalic.     Right Ear: External ear normal.     Left Ear: External ear normal.     Nose: Nose normal.     Mouth/Throat:     Lips: Pink.     Mouth: Mucous membranes are moist. No lacerations or oral lesions.     Dentition: No dental caries.     Tongue: No lesions.     Palate: No mass and lesions.     Pharynx: No pharyngeal swelling, oropharyngeal exudate or posterior oropharyngeal erythema.     Tonsils: No tonsillar exudate or tonsillar abscesses.  Eyes:     Pupils: Pupils are equal, round, and reactive to light.  Cardiovascular:     Rate and Rhythm: Normal rate and regular rhythm.  Pulmonary:     Effort: Pulmonary effort is normal.     Breath sounds: Normal breath sounds.  Genitourinary:    Comments: Deferred, declined a genital exam  Musculoskeletal:     Cervical back: Full passive range of motion without pain, normal range of motion and neck supple.  Skin:    General: Skin is warm and dry.  Neurological:     Mental Status: She is alert and oriented to person, place, and time.  Psychiatric:        Attention and Perception: Attention normal.        Mood and Affect: Mood normal.        Speech: Speech normal.  Behavior: Behavior normal. Behavior is cooperative.       Assessment and Plan:  Monica Moran is a 31 y.o. female presenting to the [facility] Department for a Women's Health problem visit  1. Family planning counseling -31 year old female in clinic today for a physical and Depo. Patient informed that RP not due until 12/2021.   -STD screening offered, patient declined.    2. Surveillance for Depo-Provera contraception -May have Depo 150 MG IM x 1 dose.  - medroxyPROGESTERone (DEPO-PROVERA) injection 150 mg   Total time spent: 20 minutes  Return in about 11 weeks (around 01/24/2022) for Routine DMPA injection.   Gregary Cromer, FNP

## 2021-11-08 NOTE — Progress Notes (Signed)
Patient in clinic today for a PPD reading.  40m. [name] FNP

## 2022-04-28 ENCOUNTER — Ambulatory Visit
Admission: EM | Admit: 2022-04-28 | Discharge: 2022-04-28 | Disposition: A | Discharge status: Home / Self Care | Payer: Medicaid Other | Attending: Emergency Medicine | Admitting: Emergency Medicine

## 2022-04-28 DIAGNOSIS — Z1152 Encounter for screening for COVID-19: Secondary | ICD-10-CM | POA: Insufficient documentation

## 2022-04-28 DIAGNOSIS — J101 Influenza due to other identified influenza virus with other respiratory manifestations: Secondary | ICD-10-CM | POA: Diagnosis not present

## 2022-04-28 LAB — RESP PANEL BY RT-PCR (RSV, FLU A&B, COVID)  RVPGX2
Influenza A by PCR: NEGATIVE
Influenza B by PCR: POSITIVE — AB
Resp Syncytial Virus by PCR: NEGATIVE
SARS Coronavirus 2 by RT PCR: NEGATIVE

## 2022-04-28 MED ORDER — BENZONATATE 100 MG PO CAPS: 200.0000 mg | ORAL_CAPSULE | Freq: Three times a day (TID) | ORAL | 0 refills | Status: AC

## 2022-04-28 MED ORDER — IPRATROPIUM BROMIDE 0.06 % NA SOLN: 2.0000 | Freq: Four times a day (QID) | NASAL | 12 refills | Status: AC

## 2022-04-28 NOTE — Discharge Instructions (Addendum)
You have tested positive for influenza B today but you are outside the therapeutic window for Tamiflu.  Take over-the-counter Tylenol and ibuprofen according to package instructions as needed for fever and body aches.  Use the Atrovent nasal spray, 2 squirts up each nostril every 6 hours, as needed for nasal congestion and postnasal drip.  You can take over-the-counter cough preparations such as Delsym, Robitussin, or Zarbee's as needed for cough and congestion.  You can also use the Tessalon Perles every 8 hours during the day as needed for cough.  Use the Zofran every 8 hours as needed for nausea.  Return for reevaluation, or see your primary care provider, for any continued or worsening symptoms.

## 2022-04-28 NOTE — ED Provider Notes (Signed)
[facility]    CSN: [id number] Arrival date & time: 04/28/22  1354      History   Chief Complaint Chief Complaint  Patient presents with   Headache   Nasal Congestion   Generalized Body Aches   Nausea    HPI Monica Moran is a 32 y.o. female.   HPI  32 year old female here for evaluation of flulike symptoms.  The patient reports that for last 3 days she has been experiencing headache, body aches, runny nose, nasal congestion with clear nasal discharge, nonproductive cough, nausea, and had 1 episode of vomiting.  She denies any fever, shortness of breath, wheezing, or diarrhea.  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  History reviewed. No pertinent past medical history.  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 Authorizing Provider  benzonatate (TESSALON) 100 MG capsule Take 2 capsules (200 mg total) by mouth every 8 (eight) hours. 04/28/22  Yes Margarette Canada, NP  ipratropium (ATROVENT) 0.06 % nasal spray Place 2 sprays into both nostrils 4 (four) times daily. 04/28/22  Yes Margarette Canada, NP  fluticasone (FLONASE) 50 MCG/ACT nasal spray Place 2 sprays into both nostrils daily. Patient not taking:  Reported on 11/05/2021 02/22/21   Margarette Canada, NP    Family History Family History  Problem Relation Age of Onset   Hypertension Maternal Grandmother    Diabetes Maternal Grandmother     Social History Social History   Tobacco Use   Smoking status: Never   Smokeless tobacco: Never  Vaping Use   Vaping Use: Never used  Substance Use Topics   Alcohol use: No   Drug use: Not Currently    Types: Marijuana    Comment: As a teenager     Allergies   Patient has no known allergies.   Review of Systems Review of Systems  Constitutional:  Negative for fever.  HENT:  Positive for congestion and rhinorrhea. Negative for ear pain and sore throat.   Respiratory:  Positive for cough. Negative for shortness of breath and wheezing.   Musculoskeletal:  Positive for arthralgias and myalgias.  Skin:  Negative for rash.  Neurological:  Positive for headaches.  Hematological: Negative.   Psychiatric/Behavioral: Negative.       Physical Exam Triage Vital Signs ED Triage Vitals  Enc Vitals Group     BP      Pulse  Resp      Temp      Temp src      SpO2      Weight      Height      Head Circumference      Peak Flow      Pain Score      Pain Loc      Pain Edu?      Excl. in [place]?    No data found.  Updated Vital Signs BP (!) 140/92 (BP Location: Right Arm)   Pulse (!) 101   Temp 98 F (36.7 C) (Oral)   Resp 16   LMP 04/14/2022 (Approximate)   SpO2 98%   Breastfeeding Yes   Visual Acuity Right Eye Distance:   Left Eye Distance:   Bilateral Distance:    Right Eye Near:   Left Eye Near:    Bilateral Near:     Physical Exam Vitals and nursing note reviewed.  Constitutional:      Appearance: Normal appearance. She is not ill-appearing.  HENT:     Head: Normocephalic and atraumatic.     Right Ear: Tympanic membrane, ear canal and external ear normal. There is no impacted cerumen.     Left Ear: Tympanic membrane, ear canal and external ear normal. There is no  impacted cerumen.     Nose: Congestion and rhinorrhea present.     Comments: Nasal mucosa is erythematous and edematous with clear discharge in both nares.    Mouth/Throat:     Mouth: Mucous membranes are moist.     Pharynx: Oropharynx is clear. Posterior oropharyngeal erythema present. No oropharyngeal exudate.     Comments: Patient has mild erythema in the posterior oropharynx but no injection.  There is clear postnasal drip present on exam. Cardiovascular:     Rate and Rhythm: Normal rate and regular rhythm.     Pulses: Normal pulses.     Heart sounds: Normal heart sounds. No murmur heard.    No friction rub. No gallop.  Pulmonary:     Effort: Pulmonary effort is normal.     Breath sounds: Normal breath sounds. No wheezing, rhonchi or rales.  Musculoskeletal:     Cervical back: Normal range of motion and neck supple.  Lymphadenopathy:     Cervical: No cervical adenopathy.  Skin:    General: Skin is warm and dry.     Capillary Refill: Capillary refill takes less than 2 seconds.     Findings: No erythema or rash.  Neurological:     General: No focal deficit present.     Mental Status: She is alert and oriented to person, place, and time.  Psychiatric:        Mood and Affect: Mood normal.        Behavior: Behavior normal.        Thought Content: Thought content normal.        Judgment: Judgment normal.      UC Treatments / Results  Labs (all labs ordered are listed, but only abnormal results are displayed) Labs Reviewed  RESP PANEL BY RT-PCR (RSV, FLU A&B, COVID)  RVPGX2 - Abnormal; Notable for the following components:      Result Value   Influenza B by PCR POSITIVE (*)    All other components within normal limits    EKG   Radiology No results found.  Procedures Procedures (including critical care time)  Medications Ordered in UC Medications - No data to display  Initial Impression /  Assessment and Plan / UC Course  I have reviewed the triage vital signs and  the nursing notes.  Pertinent labs & imaging results that were available during my care of the patient were reviewed by me and considered in my medical decision making (see chart for details).   Patient is a nontoxic-appearing 32 year old female here for evaluation of flulike symptoms have been going on for the past 3 days.  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  Her physical exam reveals inflammation of her nasal mucosa with clear rhinorrhea in both nares.  She also is clear postnasal drip in the posterior oropharynx.  No cervical lymphadenopathy on exam and the cardiopulmonary exam reveals clear lung sounds in all fields.  The patient has influenza, or another respiratory illness, and I will order a respiratory panel to look for the presence of influenza or COVID.  Respiratory panel is positive for influenza B.  I will discharge patient home with a diagnosis of influenza B.  She is complaining of some nausea so I will prescribe some Zofran that she can use as needed.  Tylenol and ibuprofen as needed for fever and body aches.  Atrovent nasal spray to help with the nasal congestion and she can use over-the-counter cough preparations as needed for cough and congestion..  She is outside the therapeutic window for Tamiflu as she has had symptoms for the past 3 days.   Final Clinical Impressions(s) / UC Diagnoses   Final diagnoses:  Influenza B     Discharge Instructions      You have tested positive for influenza B today but you are outside the therapeutic window for Tamiflu.  Take over-the-counter Tylenol and ibuprofen according to package instructions as needed for fever and body aches.  Use the Atrovent nasal spray, 2 squirts up each nostril every 6 hours, as needed for nasal congestion and postnasal drip.  You can take over-the-counter cough preparations such as Delsym, Robitussin, or  Zarbee's as needed for cough and congestion.  You can also use the Tessalon Perles every 8 hours during the day as needed for cough.  Use the Zofran every 8 hours as needed for nausea.  Return for reevaluation, or see your primary care provider, for any continued or worsening symptoms.     ED Prescriptions     Medication Sig Dispense Auth. Provider   ipratropium (ATROVENT) 0.06 % nasal spray Place 2 sprays into both nostrils 4 (four) times daily. 15 mL Margarette Canada, NP   benzonatate (TESSALON) 100 MG capsule Take 2 capsules (200 mg total) by mouth every 8 (eight) hours. 21 capsule Margarette Canada, NP      PDMP not reviewed this encounter.   Margarette Canada, NP 04/28/22 1600

## 2022-04-28 NOTE — ED Triage Notes (Signed)
Patient presents to Urgent Care with complaints of HA, nasal congestion, body aches, nausea x 3 days. Taking tylenol, ibuprofen, and mucinex for symptom management.   Denies fever.

## 2022-07-15 ENCOUNTER — Ambulatory Visit (LOCAL_COMMUNITY_HEALTH_CENTER): Payer: Medicaid Other | Admitting: Advanced Practice Midwife

## 2022-07-15 VITALS — BP 134/89 | Ht 68.0 in | Wt 187.2 lb

## 2022-07-15 DIAGNOSIS — Z3202 Encounter for pregnancy test, result negative: Secondary | ICD-10-CM

## 2022-07-15 DIAGNOSIS — Z30013 Encounter for initial prescription of injectable contraceptive: Secondary | ICD-10-CM

## 2022-07-15 DIAGNOSIS — Z3042 Encounter for surveillance of injectable contraceptive: Secondary | ICD-10-CM

## 2022-07-15 DIAGNOSIS — Z309 Encounter for contraceptive management, unspecified: Secondary | ICD-10-CM

## 2022-07-15 DIAGNOSIS — Z3009 Encounter for other general counseling and advice on contraception: Secondary | ICD-10-CM

## 2022-07-15 LAB — PREGNANCY, URINE: Preg Test, Ur: NEGATIVE

## 2022-07-15 MED ORDER — MEDROXYPROGESTERONE ACETATE 150 MG/ML IM SUSP
150.0000 mg | Freq: Once | INTRAMUSCULAR | Status: AC
Start: 2022-07-15 — End: 2022-07-15
  Administered 2022-07-15: 150 mg via INTRAMUSCULAR

## 2022-07-15 NOTE — Progress Notes (Signed)
[facility] [address] Main Number: [phone]  Contraception/Family Planning VISIT ENCOUNTER NOTE  Subjective:   Monica Moran is a 32 y.o. SBF [id number] (10,9,1) female here for reproductive life counseling. The patient is currently using No Method - Other Reason to prevent pregnancy.    The patient does not want a pregnancy in the next year.    Client states they are looking for the following:  High efficacy at preventing pregnancy  Denies abnormal vaginal bleeding, discharge, pelvic pain, problems with intercourse or other gynecologic concerns.    Gynecologic History Patient's last menstrual period was 06/30/2022 (exact date).  Health Maintenance Due  Topic Date Due   COVID-19 Vaccine (1) Never done   HIV Screening  Never done   Hepatitis C Screening  Never done   DTaP/Tdap/Td (1 - Tdap) Never done     The following portions of the patient's history were reviewed and updated as appropriate: allergies, current medications, past family history, past medical history, past social history, past surgical history and problem list.  Review of Systems Pertinent items are noted in HPI.   Objective:  BP 134/89 (BP Location: Right Arm, Patient Position: Sitting, Cuff Size: Normal)   Ht 5\' 8"  (1.727 m)   Wt 187 lb 3.2 oz (84.9 kg)   LMP 06/30/2022 (Exact Date)   BMI 28.46 kg/m  Gen: well appearing, NAD HEENT: no scleral icterus CV: RR Lung: Normal WOB Ext: warm well perfused   Assessment and Plan:   Need for emergency contraceptive care was assessed today.  Last unprotected sex was:  07/12/22 without condom; with current partner x 12 years. Took Plan B after that sex. LMP 06/30/22. Last DMPA 11/08/21. Breastfeeding 32 yo 2x/day and at night.  Last pap 11/02/19 neg. Last PE 12/14/20.  Patient reported Unprotected sex within past 120 hours.  Reviewed options and patient desired No method of ECP, declined all     Contraception counseling: Reviewed methods in a patient centered fashion and used shared decision making with the patient. Utilized [employer] patient education tools as appropriate. The patient stated there goals and desires from a method are: High efficacy at preventing pregnancy  We reviewed the following methods in detail based on patient preferences available included: Hormonal Injection  Patient expressed they would like Hormonal Injection  This was provided to the patient today.  if not why not clearly documented  Risks, benefits, and typical effectiveness rates were reviewed.  Questions were answered.  Written information was also given to the patient to review.       will follow up in  11-13 weeks for surveillance.   was told to call with any further questions, or with any concerns about this method of contraception or cycle control.  Emphasized use of condoms 100% of the time for STI prevention.   1. Encounter for surveillance of injectable contraceptive  - Pregnancy, urine  2. Family planning Needs physical before gets next DMPA--last PE 12/14/20. Last pap 11/02/19 neg  3. Encounter for initial prescription of injectable contraceptive If PT neg today may have DMPA 150 mg IM q 11-13 wks x 1 year Please counsel pt to abstain next 7 days Please counsel pt to do PT 34/20/24 and call if +    Please refer to After Visit Summary for other counseling recommendations.   No follow-ups on file.  Alberteen Spindle, CNM [facility] DEPARTMENT

## 2022-07-15 NOTE — Progress Notes (Signed)
Pt here for Depo Provera injection.  Last Depo given 11-08-21.  Pt currently 35 weeks 4 days post injection. Pregnancy test today is negative.  Depo Provera 150mg  IM given in L deltoid without complications.  Pt counseled to use condoms as back up x 7 days.  Condoms given.  Pt needs to schedule physical exam with next injection.-Kathaleya Mcduffee, RN

## 2022-08-25 ENCOUNTER — Other Ambulatory Visit: Payer: Medicaid Other

## 2022-09-30 ENCOUNTER — Ambulatory Visit: Payer: Medicaid Other

## 2022-09-30 ENCOUNTER — Ambulatory Visit (LOCAL_COMMUNITY_HEALTH_CENTER): Payer: Medicaid Other

## 2022-09-30 ENCOUNTER — Other Ambulatory Visit: Payer: Medicaid Other

## 2022-09-30 VITALS — BP 135/92 | Ht 68.0 in | Wt 187.0 lb

## 2022-09-30 DIAGNOSIS — Z30013 Encounter for initial prescription of injectable contraceptive: Secondary | ICD-10-CM | POA: Diagnosis not present

## 2022-09-30 DIAGNOSIS — Z3042 Encounter for surveillance of injectable contraceptive: Secondary | ICD-10-CM

## 2022-09-30 DIAGNOSIS — Z111 Encounter for screening for respiratory tuberculosis: Secondary | ICD-10-CM

## 2022-09-30 DIAGNOSIS — Z309 Encounter for contraceptive management, unspecified: Secondary | ICD-10-CM

## 2022-09-30 DIAGNOSIS — Z3009 Encounter for other general counseling and advice on contraception: Secondary | ICD-10-CM

## 2022-09-30 MED ORDER — MEDROXYPROGESTERONE ACETATE 150 MG/ML IM SUSP
150.0000 mg | INTRAMUSCULAR | Status: AC
Start: 2022-09-30 — End: 2023-06-27
  Administered 2022-09-30: 150 mg via INTRAMUSCULAR

## 2022-09-30 NOTE — Progress Notes (Signed)
/  Client 11 week 0 days post depo and voices no concerns.   BP slightly elevated.  Consulted with Lenice Llamas, FNP and she approved the patient could receive DEPO today.  Patient reminded that she is due for a Physical Exam.  Client given a reminder card to schedule physical exam, and a reminder that next depo due 12/16/2022.  Client verbalized understanding.   Client had reminder card for next appointment  Depo administered per order by E.Sciora, CNM dated 07/15/2022. Tolerated well in Right Deltoid.

## 2022-10-03 ENCOUNTER — Other Ambulatory Visit: Payer: Medicaid Other

## 2022-10-03 ENCOUNTER — Ambulatory Visit (LOCAL_COMMUNITY_HEALTH_CENTER): Payer: Medicaid Other

## 2022-10-03 DIAGNOSIS — Z111 Encounter for screening for respiratory tuberculosis: Secondary | ICD-10-CM

## 2022-10-03 LAB — TB SKIN TEST
Induration: 0 mm
TB Skin Test: NEGATIVE

## 2022-12-19 ENCOUNTER — Encounter: Payer: Self-pay | Admitting: Family Medicine

## 2022-12-19 ENCOUNTER — Ambulatory Visit: Payer: Medicaid Other | Admitting: Family Medicine

## 2022-12-19 VITALS — BP 149/98 | HR 84 | Ht 65.0 in | Wt 194.8 lb

## 2022-12-19 DIAGNOSIS — Z01419 Encounter for gynecological examination (general) (routine) without abnormal findings: Secondary | ICD-10-CM

## 2022-12-19 DIAGNOSIS — Z3009 Encounter for other general counseling and advice on contraception: Secondary | ICD-10-CM

## 2022-12-19 DIAGNOSIS — R87610 Atypical squamous cells of undetermined significance on cytologic smear of cervix (ASC-US): Secondary | ICD-10-CM | POA: Diagnosis not present

## 2022-12-19 DIAGNOSIS — Z113 Encounter for screening for infections with a predominantly sexual mode of transmission: Secondary | ICD-10-CM

## 2022-12-19 DIAGNOSIS — Z309 Encounter for contraceptive management, unspecified: Secondary | ICD-10-CM | POA: Diagnosis not present

## 2022-12-19 LAB — WET PREP FOR TRICH, YEAST, CLUE
Trichomonas Exam: NEGATIVE
Yeast Exam: NEGATIVE

## 2022-12-19 MED ORDER — MEDROXYPROGESTERONE ACETATE 150 MG/ML IM SUSP
150.0000 mg | INTRAMUSCULAR | Status: DC
Start: 2022-12-19 — End: 2023-12-21
  Administered 2023-03-25 – 2023-06-11 (×2): 150 mg via INTRAMUSCULAR

## 2022-12-19 NOTE — Progress Notes (Signed)
Pt is here for family planning visit.  Packet given and reviewed.  Wet prep results reviewed with pt, no treatment required per standing order.  Depo and reminder card given.  Condoms declined.  Gaspar Garbe, RN

## 2022-12-19 NOTE — Progress Notes (Signed)

## 2022-12-19 NOTE — Progress Notes (Signed)
[facility] DEPARTMENT [facility] [address] Main Number: [phone]  Family Planning Visit- Repeat Yearly Visit  Subjective:  Monica Moran is a 32 y.o. [phone]  being seen today for an annual wellness visit and to discuss contraception options.   The patient is currently using Hormonal Injection for pregnancy prevention. Patient does not want a pregnancy in the next year.    The patient reports they are looking for a method that provides High efficacy at preventing pregnancy   Patient has the following medical problems: has Laboratory animal allergy; Uses contraceptive implants for birth control; Adjustment disorder with mixed disturbance of emotions and conduct; and Mild recurrent major depression ([facility]) on their problem list.  Chief Complaint  Patient presents with   Annual Exam    PE/BC is depo    Patient reports to clinic today for PE and repeat depo. She is happy with the depo injection as her method of pregnancy prevention and wishes to stay on that. She is currently breastfeeding a 10 month old and is trying to wean.   Patient denies ADE with depo. She denies S/S of STI, but wishes to have wet prep and GC/Chlamydia vaginal swabs in office today. She declines Syphillis/HIV testing.  See flowsheet for other program required questions.   Body mass index is 32.42 kg/m. - Patient is eligible for diabetes screening based on BMI> 25 and age >35?  no HA1C ordered? not applicable  Patient reports 1 of partners in last year. Desires STI screening?  Yes   Has patient been screened once for HCV in the past?  Yes  06/08/20: "Normal"  Does the patient have current of drug use, have a partner with drug use, and/or has been incarcerated since last result? No  If yes-- Screen for HCV through [facility]   Does the patient meet criteria for HBV testing? No  Criteria:  -Household, sexual or needle sharing contact with HBV -History of drug  use -HIV positive -Those with known Hep C   Health Maintenance Due  Topic Date Due   HIV Screening  Never done   Hepatitis C Screening  Never done   DTaP/Tdap/Td (1 - Tdap) Never done   INFLUENZA VACCINE  Never done   COVID-19 Vaccine (1 - 2023-24 season) Never done   PAP SMEAR-Modifier  11/02/2022    Review of Systems  Constitutional: Negative.   HENT: Negative.    Eyes: Negative.   Respiratory: Negative.    Cardiovascular: Negative.   Gastrointestinal: Negative.   Genitourinary: Negative.   Musculoskeletal: Negative.   Skin: Negative.   Neurological: Negative.   Endo/Heme/Allergies: Negative.   Psychiatric/Behavioral: Negative.      The following portions of the patient's history were reviewed and updated as appropriate: allergies, current medications, past family history, past medical history, past social history, past surgical history and problem list. Problem list updated.  Objective:   Vitals:   12/19/22 0927  BP: (!) 149/98  Pulse: 84  Weight: 194 lb 12.8 oz (88.4 kg)  Height: 5\' 5"  (1.651 m)    Physical Exam Vitals and nursing note reviewed. Exam conducted with a chaperone present Lenice Llamas, FNP present as chaperone during exam).  Constitutional:      Appearance: Normal appearance.  HENT:     Head: Normocephalic.     Salivary Glands: Right salivary gland is not diffusely enlarged or tender. Left salivary gland is not diffusely enlarged or tender.     Mouth/Throat:  Lips: Pink.     Mouth: Mucous membranes are moist.     Tongue: No lesions. Tongue does not deviate from midline.     Pharynx: Oropharynx is clear. Uvula midline. No oropharyngeal exudate or posterior oropharyngeal erythema.     Tonsils: No tonsillar exudate.  Eyes:     General:        Right eye: No discharge.        Left eye: No discharge.  Pulmonary:     Effort: Pulmonary effort is normal.  Chest:     Comments: CBE deferred. Abdominal:     General: Abdomen is flat.      Palpations: Abdomen is soft.     Tenderness: There is no abdominal tenderness. There is no guarding or rebound.  Genitourinary:    General: Normal vulva.     Exam position: Lithotomy position.     Pubic Area: No rash or pubic lice.      Tanner stage (genital): 5.     Vagina: Normal. No vaginal discharge or erythema.     Cervix: Cervical bleeding present. No cervical motion tenderness or discharge.     Uterus: Normal.      Adnexa: Right adnexa normal and left adnexa normal.     Rectum: Normal.     Comments: pH unable to be tested-patient menstruating.  Lymphadenopathy:     Head:     Right side of head: No submental, submandibular, tonsillar, preauricular or posterior auricular adenopathy.     Left side of head: No submental, submandibular, tonsillar, preauricular or posterior auricular adenopathy.     Cervical: No cervical adenopathy.     Right cervical: No superficial or posterior cervical adenopathy.    Left cervical: No superficial or posterior cervical adenopathy.     Upper Body:     Right upper body: No supraclavicular or axillary adenopathy.     Left upper body: No supraclavicular or axillary adenopathy.     Lower Body: No right inguinal adenopathy. No left inguinal adenopathy.  Skin:    General: Skin is warm and dry.     Comments: Skin tone appropriate for ethnicity.   Neurological:     Mental Status: She is alert and oriented to person, place, and time.  Psychiatric:        Attention and Perception: Attention normal.        Mood and Affect: Mood normal.        Speech: Speech normal.        Behavior: Behavior normal. Behavior is cooperative.       Assessment and Plan:  Monica Moran is a 32 y.o. female [phone] presenting to the [facility] Department for an yearly wellness and contraception visit  Contraception counseling: Reviewed options based on patient desire and reproductive life plan. Patient is interested in Hormonal Injection. This was provided to  the patient today.   Risks, benefits, and typical effectiveness rates were reviewed.  Questions were answered.  Written information was also given to the patient to review.    The patient will follow up in  3 months for surveillance.  The patient was told to call with any further questions, or with any concerns about this method of contraception.  Emphasized use of condoms 100% of the time for STI prevention.  Educated on ECP and assessed need for ECP. Patient was NOT offered ECP based on not having unprotected sex.   1. Screening for venereal disease  - WET PREP FOR TRICH, YEAST, CLUE -  Chlamydia/Gonorrhea [facility]  Treat wet prep per standing order.   2. Family planning  - medroxyPROGESTERone (DEPO-PROVERA) injection 150 mg  3. Well woman exam  - IGP, Aptima HPV Patient's bp elevated at today's visit to 149/98. Looking back it was 134/89 on 07-15-22 and 135/92 on 09-30-22. Discussed per telephone call with patient the concerns with a blood pressure in this range and that it is consistent with stage 2 HTN; however one reading is not enough to diagnose stage 2 HTN. She reports her bp is always high at medical appointments. It was suggested that it would be best if she was able to monitor her bp at home daily and keep a record of it and to make an appointment with a PCP as we are unable to manage and treat HTN at this clinic. She reports having an appointment in October with a new PCP and she will discuss bp with them. Red flag warning signs for HTN crisis and side effects d/t HTN explained (thunderclap HA, other s/s of CVA, SOB, CP, changes in vision) and pt was advised to seek emergent medical attention by calling 911 or going to the nearest emergency room if any of these present. Patient's questions were answered and patient verbalized understanding. She was advised to contact this office back with any further questions she may have.    Return in about 3 months (around 03/20/2023) for depo  injection.  No future appointments.  Edmonia James, NP

## 2022-12-26 LAB — IGP, APTIMA HPV
HPV Aptima: NEGATIVE
PAP Smear Comment: 0

## 2022-12-26 LAB — SPECIMEN STATUS REPORT

## 2023-01-23 DIAGNOSIS — L659 Nonscarring hair loss, unspecified: Secondary | ICD-10-CM | POA: Diagnosis not present

## 2023-03-25 ENCOUNTER — Ambulatory Visit: Payer: Medicaid Other

## 2023-03-25 VITALS — BP 120/82 | Ht 65.0 in | Wt 209.0 lb

## 2023-03-25 DIAGNOSIS — Z3042 Encounter for surveillance of injectable contraceptive: Secondary | ICD-10-CM

## 2023-03-25 DIAGNOSIS — Z309 Encounter for contraceptive management, unspecified: Secondary | ICD-10-CM | POA: Diagnosis not present

## 2023-03-25 DIAGNOSIS — Z30013 Encounter for initial prescription of injectable contraceptive: Secondary | ICD-10-CM | POA: Diagnosis not present

## 2023-03-25 DIAGNOSIS — Z3009 Encounter for other general counseling and advice on contraception: Secondary | ICD-10-CM

## 2023-03-25 NOTE — Progress Notes (Signed)
13w 5d post depo. Voices no concerns. Depo given today per order by J. Idol, FNP dated 12/19/2022. Tolerated well in L deltoid. Next depo due 06/10/2023.   Abagail Kitchens, RN

## 2023-06-11 ENCOUNTER — Ambulatory Visit

## 2023-06-11 VITALS — BP 146/91 | Ht 65.0 in | Wt 210.5 lb

## 2023-06-11 DIAGNOSIS — Z3009 Encounter for other general counseling and advice on contraception: Secondary | ICD-10-CM

## 2023-06-11 DIAGNOSIS — Z30013 Encounter for initial prescription of injectable contraceptive: Secondary | ICD-10-CM | POA: Diagnosis not present

## 2023-06-11 DIAGNOSIS — Z309 Encounter for contraceptive management, unspecified: Secondary | ICD-10-CM | POA: Diagnosis not present

## 2023-06-11 DIAGNOSIS — Z3042 Encounter for surveillance of injectable contraceptive: Secondary | ICD-10-CM

## 2023-06-11 NOTE — Progress Notes (Signed)
 11 Weeks   1 Days since last Depo  BP elevated today at  155/93 and 10 min later =146/91. States BP always high when comes to clinic. Denies caffeine and smoking. Explains she established PCP and had visit approx 02/2023. No BP meds recommended. Was advised to take BP reading regularly at home. Patient states she has not done that. RN counseled on taking BP and where/how to do so.    Consult A Streilein, PA-C and informed of patient status. OK to give depo today based on order by Leona Singleton, FNP dated 12/19/2022.   BP readings today written down and given to patient.   Tolerated well R delt.  Next depo due 08/27/2023 has reminder card.  Jerel Shepherd, RN

## 2023-09-10 ENCOUNTER — Encounter: Payer: Self-pay | Admitting: Family Medicine

## 2023-09-10 ENCOUNTER — Ambulatory Visit: Admitting: Family Medicine

## 2023-09-10 VITALS — BP 137/91 | HR 91 | Ht 65.0 in | Wt 209.6 lb

## 2023-09-10 DIAGNOSIS — Z30013 Encounter for initial prescription of injectable contraceptive: Secondary | ICD-10-CM | POA: Diagnosis not present

## 2023-09-10 DIAGNOSIS — Z3042 Encounter for surveillance of injectable contraceptive: Secondary | ICD-10-CM

## 2023-09-10 DIAGNOSIS — R030 Elevated blood-pressure reading, without diagnosis of hypertension: Secondary | ICD-10-CM | POA: Insufficient documentation

## 2023-09-10 DIAGNOSIS — Z309 Encounter for contraceptive management, unspecified: Secondary | ICD-10-CM | POA: Diagnosis not present

## 2023-09-10 NOTE — Progress Notes (Signed)
   [facility] Problem Visit  [facility]  Subjective:  Monica Moran is a 33 y.o. being seen today for   Chief Complaint  Patient presents with   Acute Visit    Pt is here for an acute visit and BC change     HPI   Patient presents today to change BCM. Reports she has noticed her weight has increased, and that her BP remains high. States that every time she comes for her depo shot she gets told that her BP was high. Recently she started experiencing severe HA that make her lay in bed for a day. She states she took her blood pressure at home and it was very high. She is keeping a log of this. Wants to try to patch for birth control.   Health Maintenance Due  Topic Date Due   HIV Screening  Never done   Hepatitis C Screening  Never done   DTaP/Tdap/Td (1 - Tdap) Never done   COVID-19 Vaccine (1 - 2024-25 season) Never done    ROS  The following portions of the patient's history were reviewed and updated as appropriate: allergies, current medications, past family history, past medical history, past social history, past surgical history and problem list. Problem list updated.   See flowsheet for other program required questions.  Objective:   Vitals:   09/10/23 1357  BP: (!) 137/91  Pulse: 91  Weight: 209 lb 9.6 oz (95.1 kg)  Height: 5\' 5"  (1.651 m)    Physical Exam  Deferred- due in Sept 2025  Assessment and Plan:  [name] is a 33 y.o. female presenting to the [facility] Department for a Women's Health problem visit  1. Encounter for surveillance of injectable contraceptive (Primary) -reviewed the [facility] for contraception does NOT recommend as a  BCM with estrogen when the BP is at this range -counseled on risk for blood clots and stroke  -reviewed that methods with estrogen would be safer for her- reviewed options of IUD, fem cap, condoms, etc -desires to continue with depo for now  2. Elevated  blood pressure reading -reviewed current BP, as well as hx of BP in clinic -consistently 130-140s/90s -patient reports she is keeping a long and going back to PCP -encouraged her that she is helping her body for the long term by working to control her BP    No follow-ups on file.  No future appointments.  Earleen Glazier, [place]

## 2023-09-10 NOTE — Progress Notes (Signed)
 Patient is here for an Acute visit and Depo. Injection given today at the [facility] and pt tolerated well to injection. Reminder card given. Austine Lefort, RN.

## 2023-09-14 DIAGNOSIS — R030 Elevated blood-pressure reading, without diagnosis of hypertension: Secondary | ICD-10-CM | POA: Diagnosis not present

## 2023-12-21 ENCOUNTER — Other Ambulatory Visit: Payer: Self-pay

## 2023-12-21 ENCOUNTER — Ambulatory Visit

## 2023-12-21 VITALS — BP 146/96 | HR 97 | Ht 65.0 in | Wt 205.2 lb

## 2023-12-21 DIAGNOSIS — Z3202 Encounter for pregnancy test, result negative: Secondary | ICD-10-CM | POA: Diagnosis not present

## 2023-12-21 DIAGNOSIS — Z3042 Encounter for surveillance of injectable contraceptive: Secondary | ICD-10-CM

## 2023-12-21 DIAGNOSIS — Z30013 Encounter for initial prescription of injectable contraceptive: Secondary | ICD-10-CM | POA: Diagnosis not present

## 2023-12-21 DIAGNOSIS — Z7251 High risk heterosexual behavior: Secondary | ICD-10-CM

## 2023-12-21 DIAGNOSIS — Z3009 Encounter for other general counseling and advice on contraception: Secondary | ICD-10-CM

## 2023-12-21 DIAGNOSIS — Z309 Encounter for contraceptive management, unspecified: Secondary | ICD-10-CM | POA: Diagnosis not present

## 2023-12-21 LAB — PREGNANCY, URINE: Preg Test, Ur: NEGATIVE

## 2023-12-21 MED ORDER — LEVONORGESTREL 1.5 MG PO TABS: 1.5000 mg | ORAL_TABLET | Freq: Once | ORAL | 0 refills | Status: AC

## 2023-12-21 MED ORDER — ULIPRISTAL ACETATE 30 MG PO TABS: 30.0000 mg | ORAL_TABLET | Freq: Once | ORAL | Status: DC

## 2023-12-21 MED ORDER — MEDROXYPROGESTERONE ACETATE 150 MG/ML IM SUSP
150.0000 mg | INTRAMUSCULAR | Status: AC
  Administered 2023-12-21 – 2024-03-08 (×2): 150 mg via INTRAMUSCULAR

## 2023-12-21 NOTE — Progress Notes (Signed)
 [employer] [facility] DEPARTMENT [facility] [address] [place] [address] Main phone: [phone]  Family Planning Visit - Repeat Yearly Visit  Subjective:  Monica Moran is a 33 y.o. G5P3003  being seen today for an annual wellness visit and to discuss contraception options. The patient is currently using hormonal injection for pregnancy prevention. Patient does not want a pregnancy in the next year.   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 Last depo shot was 06/11/23 or 27 weeks 4 days ago. She did have a period this month, has irregular periods with the Depo. Stopped bleeding yesterday. Had unprotected sex yesterday.  BP: high BP today at 148/101, repeat 146/96. Has cuff at home. Has seen PCP for her blood pressure. Trying some lifestyle changes, but struggles to implement. She does admit to not eating well due to getting home late with her children, difficulty having time to cook. 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 Walks a lot and stays active with her children. Endorses stress.  Does have some migraines - been around 6 months since last migraine.  Discussed Depo and high blood pressure. Endorses fear of needles  and wondering if this causes her to have high blood pressure when she comes. Discussed that if her blood pressure continues to rise, that Depo is not the safest contraceptive option for her. Advised to her to see PCP in next few months to discuss. Her PCP has mentioned initiating medication for her high blood pressure. Discussed well controlled BP is safe with Depo.  Reviewed lifestyle changes including sodium reduction, daily exercise, and stress reduction. Discussed options for contraception in future if blood pressure prevents her from being able to safely continue DP. No vaginal or breast concerns. Declines STI testing.  Review of Systems  Constitutional:  Negative for chills and fever.  Eyes:  Negative for blurred vision.  Respiratory:  Negative for shortness of breath.   Cardiovascular:  Negative for chest pain.  Gastrointestinal:  Negative for abdominal pain, constipation, diarrhea, nausea and vomiting.  Genitourinary:  Negative for dysuria.  Skin:  Negative for rash.  Neurological:  Negative for dizziness and headaches.  Endo/Heme/Allergies:  Does not bruise/bleed easily.  Psychiatric/Behavioral:  Negative for depression.   All other systems reviewed and are negative.  See flowsheet for further details and programmatic requirements Hyperlink available at the top of the signed note in blue.  Flow sheet content below:  Pregnancy Intention Screening Does the patient want to become pregnant in the next year?: No Does the patient's partner want to become pregnant in the next year?: No Would the patient like to discuss contraceptive options today?: No Other:  Password: declines Is it okay to contact you by mail?: Yes Difficulty accessing hygiene products (feminine products/soap) in the  last 3 months: No Contraception History Past methods of contraception used by patient:: Hormonal Implant, Hormonal Injection Adverse effects associated with Hormonal Injection: none Adverse effects  associated with Hormonal Implant: heavy bleeding. Sexual History What age did you start your period?: 14 How often do you have your period?: irregular on depo Date of last sex?: 12/20/23 Has the patient had unprotected sex within the last 5 days?: Yes Do you have sex with men, women, both men and women?: Men only In the past 2 months how many partners have you had sex with?: 1 In the past 12 months, how many partners have you had sex with?: 1 Is it possible that any of your sex partners in the past 12 months had sex with someone else whild they were still in a sexual relationship with you?: No What ways do you have sex?: Vaginal Do you or your partner use condoms and/or dental dams every time you have vaginal, oral or anal sex?: No Do you douche?: No Date of last HIV test?: 06/08/20 Have you ever had an STD?: No Have any of your partners had an STD?: No Have you or your partner ever shot up drugs?: No Have any of your partners used drugs in the past?: No Have you or your partners exchanged money or drugs for sex?: No  Diabetes screening This patient is 33 y.o. with a BMI of Body mass index is 34.15 kg/m.Monica Moran  Is patient eligible for diabetes screening (age >35 and BMI >25)?  no  Was Hgb A1c ordered? no  STI screening Patient reports 1 of partners in last year.  Does this patient desire STI screening?  No -   Declines all blood tests today.  Hepatitis C screening Has patient been screened once for HCV in the past?  Yes  No results found for: HCVAB  Cervical Cancer Screening   ASCUS with negative HPV last year NILM 2018 and 2021 Per [facility], three year follow up (2027)  Result Date Procedure Results Follow-ups  12/19/2022 IGP, Aptima HPV DIAGNOSIS:: Comment (A) Recommendation:: Comment (A) Specimen adequacy:: Comment Clinician Provided ICD10: Comment Performed by:: Comment Electronically signed by:: Comment PAP Smear Comment: . PATHOLOGIST PROVIDED ICD10:: Comment Note::  Comment Test Methodology: Comment HPV Aptima: Negative   11/02/2019 Cytology - PAP Adequacy: Satisfactory for evaluation; transformation zone component PRESENT. Diagnosis: - Negative for intraepithelial lesion or malignancy (NILM)   06/13/2016 Pap Lb, Ct-Ng, rfx HPV ASCU DIAGNOSIS:: Comment Specimen adequacy:: Comment Clinician Provided ICD10: Comment Performed by:: Comment QC reviewed by:: Comment PAP Smear Comment: . Note:: Comment PAP Reflex: Comment Chlamydia, Nuc. Acid Amp: Negative Gonococcus, Nuc. Acid Amp: Negative     Health Maintenance Due  Topic Date Due   HIV Screening  Never done   Hepatitis C Screening  Never done   DTaP/Tdap/Td (1 - Tdap) Never done   Hepatitis B Vaccines 19-59 Average Risk (1 of 3 - 19+ 3-dose series) Never done   HPV VACCINES (1 - 3-dose SCDM series) Never done   Influenza Vaccine  Never done   COVID-19 Vaccine (1 - 2024-25 season) Never done    The following portions of the patient's history were reviewed and updated as appropriate: allergies, current medications, past family history, past medical history, past social history, past surgical history and problem list. Problem list updated.  Objective:   Vitals:   12/21/23 0911 12/21/23 0929  BP: (!) 148/101 (!) 146/96  Pulse: 97   Weight: 205 lb 3.2 oz (93.1 kg)  Height: 5' 5 (1.651 m)    Physical Exam Constitutional:      Appearance: Normal appearance.  HENT:     Head: Normocephalic.     Mouth/Throat:     Mouth: Mucous membranes are moist.     Pharynx: Oropharynx is clear. No pharyngeal swelling, oropharyngeal exudate or posterior oropharyngeal erythema.  Eyes:     General: No scleral icterus.       Right eye: No discharge.        Left eye: No discharge.  Cardiovascular:     Heart sounds: Normal heart sounds, S1 normal and S2 normal.  Pulmonary:     Effort: Pulmonary effort is normal.     Breath sounds: Normal breath sounds.  Abdominal:     General: Abdomen is flat.      Palpations: Abdomen is soft. There is no mass.     Tenderness: There is no abdominal tenderness. There is no rebound.  Lymphadenopathy:     Cervical: No cervical adenopathy.  Skin:    General: Skin is warm and dry.  Neurological:     Mental Status: She is alert.  Psychiatric:        Mood and Affect: Mood normal.        Behavior: Behavior normal.    Assessment and Plan:  Monica Moran is a 33 y.o. female G5P3003 presenting to the [facility] Department for an yearly wellness and contraception visit  Contraception counseling:  Reviewed options based on patient desire and reproductive life plan. Patient is interested in Hormonal Injection. This was provided to the patient today.  Risks, benefits, and typical effectiveness rates were reviewed.  Questions were answered.  Written information was also given to the patient to review.    The patient will follow up in  3 months for surveillance.  The patient was told to call with any further questions, or with any concerns about this method of contraception.  Emphasized use of condoms 100% of the time for STI prevention.  Emergency Contraception Precautions (ECP): Patient assessed for need of ECP. She is a candidate based on report of unprotected sex within past 72 hours (3 days).  Educated on ECP and reviewed options.  Patient desires Ella  (Ulipristal).   1. Surveillance for Depo-Provera  contraception (Primary)  - Pregnancy, urine  2. Unprotected sex  - levonorgestrel  (PLAN B  ONE-STEP) 1.5 MG tablet; Take 1 tablet (1.5 mg total) by mouth once for 1 dose.  Dispense: 1 tablet; Refill: 0  3. Family planning  - medroxyPROGESTERone  (DEPO-PROVERA ) injection 150 mg   Return in about 3 months (around 03/21/2024). Return to PCP for high blood pressure follow up.  No future appointments.  Damien FORBES Satchel, NP

## 2023-12-21 NOTE — Progress Notes (Signed)
 Pt here for annual physical examination and restart Depo.  Requests emergency contraception.  Declines STI screening. Urine pregnancy test today negative.  Depo Provera  150mg  IM given in L deltoid without complications.  Advised patient to use condoms as back up x 7 days.  Condoms declined.  Medication for ECP sent to patient's pharmacy of choice.  Reminder card for next Depo injection provided.-Theo Reither, RN

## 2024-01-08 NOTE — Addendum Note (Signed)
 Addended by: Lashe Oliveira on: 01/08/2024 12:09 PM   Modules accepted: Orders

## 2024-03-08 ENCOUNTER — Ambulatory Visit

## 2024-03-08 VITALS — BP 131/76 | Ht 65.0 in | Wt 211.0 lb

## 2024-03-08 DIAGNOSIS — Z3042 Encounter for surveillance of injectable contraceptive: Secondary | ICD-10-CM

## 2024-03-08 DIAGNOSIS — Z3009 Encounter for other general counseling and advice on contraception: Secondary | ICD-10-CM | POA: Diagnosis not present

## 2024-03-08 DIAGNOSIS — Z30013 Encounter for initial prescription of injectable contraceptive: Secondary | ICD-10-CM | POA: Diagnosis not present

## 2024-03-08 NOTE — Progress Notes (Signed)
 11 Weeks   1 Days since last Depo Voices no concerns today.  Counseled to adhere to 11 to 13 week intervals between depo injections for optimal benefit.  Depo given today per order by E. Rosabel, [facility]  dated 12/21/2023.  Tolerated well Given right Deltoid.  Next depo due 05/24/2024 has reminder card.
